# Patient Record
Sex: Female | Born: 1965 | Race: White | Hispanic: Yes | Smoking: Former smoker
Health system: Southern US, Community
[De-identification: ages and names within clinical notes are randomized; demographics above are authoritative.]

## PROBLEM LIST (undated history)

## (undated) DIAGNOSIS — S52502A Unspecified fracture of the lower end of left radius, initial encounter for closed fracture: Secondary | ICD-10-CM

## (undated) HISTORY — PX: TUBAL LIGATION: SHX77

---

## 2009-11-01 ENCOUNTER — Inpatient Hospital Stay (HOSPITAL_COMMUNITY): Admission: EM | Admit: 2009-11-01 | Discharge: 2009-11-04 | Payer: Self-pay | Admitting: Emergency Medicine

## 2010-09-04 LAB — CBC
HCT: 35.8 % — ABNORMAL LOW (ref 36.0–46.0)
MCHC: 33.6 g/dL (ref 30.0–36.0)
MCHC: 33.7 g/dL (ref 30.0–36.0)
MCV: 81 fL (ref 78.0–100.0)
Platelets: 146 10*3/uL — ABNORMAL LOW (ref 150–400)
RDW: 16.2 % — ABNORMAL HIGH (ref 11.5–15.5)
WBC: 6.4 10*3/uL (ref 4.0–10.5)

## 2010-09-04 LAB — DIFFERENTIAL
Basophils Absolute: 0 10*3/uL (ref 0.0–0.1)
Eosinophils Absolute: 0.1 10*3/uL (ref 0.0–0.7)
Lymphocytes Relative: 37 % (ref 12–46)
Lymphs Abs: 3.1 10*3/uL (ref 0.7–4.0)
Monocytes Absolute: 0.5 10*3/uL (ref 0.1–1.0)
Monocytes Relative: 6 % (ref 3–12)
Neutro Abs: 4.7 10*3/uL (ref 1.7–7.7)

## 2010-09-04 LAB — COMPREHENSIVE METABOLIC PANEL
Albumin: 3.8 g/dL (ref 3.5–5.2)
BUN: 10 mg/dL (ref 6–23)
Calcium: 8.5 mg/dL (ref 8.4–10.5)
Chloride: 106 mEq/L (ref 96–112)
Creatinine, Ser: 0.65 mg/dL (ref 0.4–1.2)
GFR calc Af Amer: >60 mL/min (ref >60)
GFR calc non Af Amer: >60 mL/min (ref >60)
Glucose, Bld: 118 mg/dL — ABNORMAL HIGH (ref 70–99)

## 2010-09-04 LAB — BASIC METABOLIC PANEL
BUN: 8 mg/dL (ref 6–23)
Calcium: 7.5 mg/dL — ABNORMAL LOW (ref 8.4–10.5)
Chloride: 113 mEq/L — ABNORMAL HIGH (ref 96–112)
GFR calc Af Amer: >60 mL/min (ref >60)
GFR calc non Af Amer: >60 mL/min (ref >60)
Potassium: 3.2 mEq/L — ABNORMAL LOW (ref 3.5–5.1)
Sodium: 144 mEq/L (ref 135–145)

## 2010-09-04 LAB — URINE CULTURE
Colony Count: NO GROWTH
Culture: NO GROWTH

## 2010-09-04 LAB — RAPID URINE DRUG SCREEN, HOSP PERFORMED: Amphetamines: NOT DETECTED

## 2010-09-04 LAB — URINALYSIS, ROUTINE W REFLEX MICROSCOPIC
Bilirubin Urine: NEGATIVE
Glucose, UA: NEGATIVE mg/dL
Ketones, ur: NEGATIVE mg/dL
Leukocytes, UA: NEGATIVE
Nitrite: NEGATIVE
Specific Gravity, Urine: 1.024 (ref 1.005–1.030)

## 2010-09-04 LAB — PREGNANCY, URINE: Preg Test, Ur: NEGATIVE

## 2010-09-04 LAB — PROTIME-INR
INR: 0.97 (ref 0.00–1.49)
Prothrombin Time: 12.8 seconds (ref 11.6–15.2)

## 2010-09-04 LAB — LIPASE, BLOOD: Lipase: 30 U/L (ref 11–59)

## 2010-09-04 LAB — ETHANOL: Alcohol, Ethyl (B): <5 mg/dL (ref 0–10)

## 2010-09-04 LAB — LACTIC ACID, PLASMA: Lactic Acid, Venous: 2.4 mmol/L — ABNORMAL HIGH (ref 0.5–2.2)

## 2010-09-04 LAB — URINE MICROSCOPIC-ADD ON

## 2011-10-18 ENCOUNTER — Other Ambulatory Visit: Payer: Self-pay | Admitting: Obstetrics and Gynecology

## 2011-10-18 DIAGNOSIS — Z1231 Encounter for screening mammogram for malignant neoplasm of breast: Secondary | ICD-10-CM

## 2011-10-23 ENCOUNTER — Encounter: Payer: Self-pay | Admitting: *Deleted

## 2011-10-23 ENCOUNTER — Ambulatory Visit (INDEPENDENT_AMBULATORY_CARE_PROVIDER_SITE_OTHER): Payer: Self-pay | Admitting: *Deleted

## 2011-10-23 ENCOUNTER — Ambulatory Visit (HOSPITAL_COMMUNITY): Payer: Self-pay

## 2011-10-23 ENCOUNTER — Ambulatory Visit (HOSPITAL_COMMUNITY): Admission: RE | Admit: 2011-10-23 | Payer: Self-pay | Source: Ambulatory Visit

## 2011-10-23 ENCOUNTER — Other Ambulatory Visit: Payer: Self-pay | Admitting: Obstetrics and Gynecology

## 2011-10-23 VITALS — BP 117/72 | HR 73 | Temp 97.7°F | Ht <= 58 in | Wt 162.8 lb

## 2011-10-23 DIAGNOSIS — N6310 Unspecified lump in the right breast, unspecified quadrant: Secondary | ICD-10-CM

## 2011-10-23 DIAGNOSIS — N63 Unspecified lump in unspecified breast: Secondary | ICD-10-CM

## 2011-10-23 DIAGNOSIS — Z01419 Encounter for gynecological examination (general) (routine) without abnormal findings: Secondary | ICD-10-CM

## 2011-10-23 NOTE — Progress Notes (Signed)
Complaints of right breast lump.  Pap Smear:    Pap smear completed today. Per patient last Pap smear was 10 years and normal. Per patient no history of abnormal Pap smears. No Pap smear results in EPIC.  Physical exam: Breasts Breasts symmetrical. No skin abnormalities bilateral breasts. Left nipple inverted per patient is normal for her. No nipple discharge bilateral breasts. No lymphadenopathy. No lumps palpated left breast. Palpated a small lump in right breast at 1 o'clock 9 cm from the nipple. No complaints of pain or tenderness on exam.  Patient referred to the [facility] for bilateral diagnostic mammogram and possible right breat ultrasound. Appointment scheduled for Tuesday, Oct 30, 2011 at 1430.        Pelvic/Bimanual   Ext Genitalia No lesions, no swelling and no discharge observed on external genitalia.         Vagina Vagina pink and normal texture. No lesions or discharge observed in vagina.          Cervix Cervix is present. Cervix pink and of normal texture. Cervix friable. No discharge observed at cervical os.     Uterus Uterus is present and palpable. Uterus in normal position and normal size.        Adnexae Bilateral ovaries present and palpable. No tenderness on palpation.          Rectovaginal No rectal exam completed today since patient had no rectal complaints. No skin abnormalities observed on exam.

## 2011-10-23 NOTE — Patient Instructions (Addendum)
Taught patient how to perform BSE and gave educational materials to take home. Let her know [facility] will cover Pap smears every 3 years unless has a history of abnormal Pap smears. Patient referred to the [facility] for bilateral diagnostic mammogram and possible right breast ultrasound. Appointment scheduled for Tuesday, Oct 30, 2011 at 1430. Patient aware of appointment and will be there. Let patient know will follow up with her within the next couple weeks with results. Patient verbalized understanding.

## 2011-10-30 ENCOUNTER — Ambulatory Visit
Admission: RE | Admit: 2011-10-30 | Discharge: 2011-10-30 | Disposition: A | Discharge status: Home / Self Care | Payer: No Typology Code available for payment source | Source: Ambulatory Visit | Attending: Obstetrics and Gynecology | Admitting: Obstetrics and Gynecology

## 2011-10-30 DIAGNOSIS — N63 Unspecified lump in unspecified breast: Secondary | ICD-10-CM

## 2011-11-13 ENCOUNTER — Telehealth: Payer: Self-pay

## 2011-11-13 NOTE — Telephone Encounter (Signed)
Called pt with Spanish interpreter, Genella Rife, and was unable to leave message due to no voicemail/answering service.

## 2011-12-03 ENCOUNTER — Telehealth: Payer: Self-pay | Admitting: *Deleted

## 2011-12-03 NOTE — Telephone Encounter (Signed)
Used interpreter Delorise Royals. Discussed results of diagnostic mammogram and advised screening mammogram in one year.

## 2014-04-19 ENCOUNTER — Encounter: Payer: Self-pay | Admitting: *Deleted

## 2016-01-06 ENCOUNTER — Encounter (HOSPITAL_BASED_OUTPATIENT_CLINIC_OR_DEPARTMENT_OTHER): Payer: Self-pay | Admitting: *Deleted

## 2016-01-09 NOTE — H&P (Signed)
[facility] [address]   [address] [place] [address] [phone] A Division of [facility]                                                                     RE: KORALYNN, SUER   [id number]   [birth year] PROGRESS NOTE: 01-04-16 Reason for visit: Follow up left distal radius injury on December 28, 2015.   History of present illness: She was referred to me by Dr. Charlett Blake.  She was walking down some stairs and slipped and fell onto her outstretched left hand.  She denies any numbness or tingling.  She has been in a splint.  Pain has been well controlled.  Date of injury: December 28, 2015.     Please see associated documentation for this clinic visit for further past medical, family, surgical and social history, review of systems, and exam findings as this was reviewed by me.  EXAMINATION: Well appearing female in no apparent distress.  The left upper extremity she is neurovascularly intact.  She has swelling around the wrist, but it is well controlled.       X-RAYS: I reviewed three views of her wrist today.  There is significant volar displacement with shortening and a large step off at her joint.    ASSESSMENT/PLAN: Left distal radius fracture, intraarticular, with multiple fragments with articular step off.  I discussed the risks and benefits of ORIF versus non-operative treatment today.  She would like to go forward with ORIF.  We will set this up soon.    Jewel Baize.  Eulah Pont, M.D.  Electronically verified by Jewel Baize. Eulah Pont, M.D. TDM:jjh D 01-05-16 T 01-08-16

## 2016-01-10 ENCOUNTER — Ambulatory Visit (HOSPITAL_BASED_OUTPATIENT_CLINIC_OR_DEPARTMENT_OTHER)
Admission: RE | Admit: 2016-01-10 | Discharge: 2016-01-10 | Disposition: A | Discharge status: Home / Self Care | Payer: Self-pay | Source: Ambulatory Visit | Attending: Orthopedic Surgery | Admitting: Orthopedic Surgery

## 2016-01-10 ENCOUNTER — Ambulatory Visit (HOSPITAL_BASED_OUTPATIENT_CLINIC_OR_DEPARTMENT_OTHER): Payer: Self-pay | Admitting: Anesthesiology

## 2016-01-10 ENCOUNTER — Encounter (HOSPITAL_BASED_OUTPATIENT_CLINIC_OR_DEPARTMENT_OTHER)
Admission: RE | Disposition: A | Discharge status: Home / Self Care | Payer: Self-pay | Source: Ambulatory Visit | Attending: Orthopedic Surgery

## 2016-01-10 ENCOUNTER — Encounter (HOSPITAL_BASED_OUTPATIENT_CLINIC_OR_DEPARTMENT_OTHER): Payer: Self-pay | Admitting: Anesthesiology

## 2016-01-10 DIAGNOSIS — Z6830 Body mass index (BMI) 30.0-30.9, adult: Secondary | ICD-10-CM | POA: Insufficient documentation

## 2016-01-10 DIAGNOSIS — W109XXA Fall (on) (from) unspecified stairs and steps, initial encounter: Secondary | ICD-10-CM | POA: Insufficient documentation

## 2016-01-10 DIAGNOSIS — Z87891 Personal history of nicotine dependence: Secondary | ICD-10-CM | POA: Insufficient documentation

## 2016-01-10 DIAGNOSIS — S52502A Unspecified fracture of the lower end of left radius, initial encounter for closed fracture: Secondary | ICD-10-CM | POA: Insufficient documentation

## 2016-01-10 HISTORY — PX: ORIF WRIST FRACTURE: SHX2133

## 2016-01-10 HISTORY — DX: Unspecified fracture of the lower end of left radius, initial encounter for closed fracture: S52.502A

## 2016-01-10 SURGERY — OPEN REDUCTION INTERNAL FIXATION (ORIF) WRIST FRACTURE
Anesthesia: General | Site: Wrist | Laterality: Left

## 2016-01-10 MED ORDER — MIDAZOLAM HCL 2 MG/2ML IJ SOLN
INTRAMUSCULAR | Status: AC
  Filled 2016-01-10: qty 2

## 2016-01-10 MED ORDER — MIDAZOLAM HCL 2 MG/2ML IJ SOLN
1.0000 mg | INTRAMUSCULAR | Status: DC | PRN
  Administered 2016-01-10: 1 mg via INTRAVENOUS

## 2016-01-10 MED ORDER — MIDAZOLAM HCL 2 MG/2ML IJ SOLN: 0.5000 mg | Freq: Once | INTRAMUSCULAR | Status: DC | PRN

## 2016-01-10 MED ORDER — ONDANSETRON HCL 4 MG/2ML IJ SOLN
INTRAMUSCULAR | Status: AC
  Filled 2016-01-10: qty 2

## 2016-01-10 MED ORDER — DEXAMETHASONE SODIUM PHOSPHATE 10 MG/ML IJ SOLN
INTRAMUSCULAR | Status: AC
  Filled 2016-01-10: qty 1

## 2016-01-10 MED ORDER — PHENYLEPHRINE HCL 10 MG/ML IJ SOLN
INTRAMUSCULAR | Status: DC | PRN
Start: 2016-01-10 — End: 2016-01-10
  Administered 2016-01-10 (×2): 80 ug via INTRAVENOUS

## 2016-01-10 MED ORDER — MEPERIDINE HCL 25 MG/ML IJ SOLN: 6.2500 mg | INTRAMUSCULAR | Status: DC | PRN

## 2016-01-10 MED ORDER — SUCCINYLCHOLINE CHLORIDE 200 MG/10ML IV SOSY
PREFILLED_SYRINGE | INTRAVENOUS | Status: AC
  Filled 2016-01-10: qty 10

## 2016-01-10 MED ORDER — BUPIVACAINE HCL (PF) 0.5 % IJ SOLN
INTRAMUSCULAR | Status: AC
  Filled 2016-01-10: qty 30

## 2016-01-10 MED ORDER — ONDANSETRON HCL 4 MG PO TABS: 4.0000 mg | ORAL_TABLET | Freq: Three times a day (TID) | ORAL | 0 refills | Status: DC | PRN

## 2016-01-10 MED ORDER — POVIDONE-IODINE 10 % EX SWAB: 2.0000 "application " | Freq: Once | CUTANEOUS | Status: DC

## 2016-01-10 MED ORDER — BUPIVACAINE-EPINEPHRINE (PF) 0.5% -1:200000 IJ SOLN
INTRAMUSCULAR | Status: DC | PRN
  Administered 2016-01-10: 30 mL via PERINEURAL

## 2016-01-10 MED ORDER — CEFAZOLIN SODIUM-DEXTROSE 2-4 GM/100ML-% IV SOLN
INTRAVENOUS | Status: AC
  Filled 2016-01-10: qty 100

## 2016-01-10 MED ORDER — LIDOCAINE 2% (20 MG/ML) 5 ML SYRINGE
INTRAMUSCULAR | Status: AC
  Filled 2016-01-10: qty 5

## 2016-01-10 MED ORDER — ACETAMINOPHEN 500 MG PO TABS
1000.0000 mg | ORAL_TABLET | Freq: Once | ORAL | Status: AC
  Administered 2016-01-10: 1000 mg via ORAL

## 2016-01-10 MED ORDER — PROPOFOL 10 MG/ML IV BOLUS
INTRAVENOUS | Status: DC | PRN
  Administered 2016-01-10: 150 mg via INTRAVENOUS

## 2016-01-10 MED ORDER — HYDROCODONE-ACETAMINOPHEN 5-325 MG PO TABS: 1.0000 | ORAL_TABLET | ORAL | 0 refills | Status: DC | PRN

## 2016-01-10 MED ORDER — LACTATED RINGERS IV SOLN
INTRAVENOUS | Status: DC
  Administered 2016-01-10 (×2): via INTRAVENOUS

## 2016-01-10 MED ORDER — CEFAZOLIN SODIUM-DEXTROSE 2-4 GM/100ML-% IV SOLN
2.0000 g | INTRAVENOUS | Status: AC
  Administered 2016-01-10: 2 g via INTRAVENOUS

## 2016-01-10 MED ORDER — GLYCOPYRROLATE 0.2 MG/ML IJ SOLN: 0.2000 mg | Freq: Once | INTRAMUSCULAR | Status: DC | PRN

## 2016-01-10 MED ORDER — SCOPOLAMINE 1 MG/3DAYS TD PT72: 1.0000 | MEDICATED_PATCH | Freq: Once | TRANSDERMAL | Status: DC | PRN

## 2016-01-10 MED ORDER — FENTANYL CITRATE (PF) 100 MCG/2ML IJ SOLN
INTRAMUSCULAR | Status: AC
  Filled 2016-01-10: qty 2

## 2016-01-10 MED ORDER — HYDROMORPHONE HCL 1 MG/ML IJ SOLN: 0.2500 mg | INTRAMUSCULAR | Status: DC | PRN

## 2016-01-10 MED ORDER — EPHEDRINE SULFATE 50 MG/ML IJ SOLN
INTRAMUSCULAR | Status: DC | PRN
  Administered 2016-01-10: 10 mg via INTRAVENOUS

## 2016-01-10 MED ORDER — DOCUSATE SODIUM 100 MG PO CAPS: 100.0000 mg | ORAL_CAPSULE | Freq: Two times a day (BID) | ORAL | 0 refills | Status: DC

## 2016-01-10 MED ORDER — PROMETHAZINE HCL 25 MG/ML IJ SOLN: 6.2500 mg | INTRAMUSCULAR | Status: DC | PRN

## 2016-01-10 MED ORDER — CHLORHEXIDINE GLUCONATE 4 % EX LIQD: 60.0000 mL | Freq: Once | CUTANEOUS | Status: DC

## 2016-01-10 MED ORDER — FENTANYL CITRATE (PF) 100 MCG/2ML IJ SOLN
50.0000 ug | INTRAMUSCULAR | Status: DC | PRN
  Administered 2016-01-10: 50 ug via INTRAVENOUS

## 2016-01-10 MED ORDER — LIDOCAINE HCL (CARDIAC) 20 MG/ML IV SOLN
INTRAVENOUS | Status: DC | PRN
  Administered 2016-01-10: 30 mg via INTRAVENOUS

## 2016-01-10 MED ORDER — DEXAMETHASONE SODIUM PHOSPHATE 10 MG/ML IJ SOLN
INTRAMUSCULAR | Status: DC | PRN
  Administered 2016-01-10: 10 mg via INTRAVENOUS

## 2016-01-10 MED ORDER — ACETAMINOPHEN 500 MG PO TABS
ORAL_TABLET | ORAL | Status: AC
  Filled 2016-01-10: qty 2

## 2016-01-10 MED ORDER — LIDOCAINE-EPINEPHRINE (PF) 1.5 %-1:200000 IJ SOLN
INTRAMUSCULAR | Status: DC | PRN
  Administered 2016-01-10: 20 mL via PERINEURAL

## 2016-01-10 SURGICAL SUPPLY — 70 items
BANDAGE ACE 4X5 VEL STRL LF (GAUZE/BANDAGES/DRESSINGS) ×3 IMPLANT
BIT DRILL 2.2 SS TIBIAL (BIT) ×3 IMPLANT
BLADE SURG 15 STRL LF DISP TIS (BLADE) ×2 IMPLANT
BLADE SURG 15 STRL SS (BLADE) ×4
BNDG ESMARK 4X9 LF (GAUZE/BANDAGES/DRESSINGS) ×3 IMPLANT
CHLORAPREP W/TINT 26ML (MISCELLANEOUS) ×3 IMPLANT
CLOSURE STERI-STRIP 1/2X4 (GAUZE/BANDAGES/DRESSINGS) ×1
CLSR STERI-STRIP ANTIMIC 1/2X4 (GAUZE/BANDAGES/DRESSINGS) ×2 IMPLANT
CORDS BIPOLAR (ELECTRODE) ×3 IMPLANT
COVER BACK TABLE 60X90IN (DRAPES) ×3 IMPLANT
CUFF TOURNIQUET SINGLE 18IN (TOURNIQUET CUFF) ×3 IMPLANT
CUFF TOURNIQUET SINGLE 24IN (TOURNIQUET CUFF) IMPLANT
DECANTER SPIKE VIAL GLASS SM (MISCELLANEOUS) IMPLANT
DRAPE EXTREMITY T 121X128X90 (DRAPE) ×3 IMPLANT
DRAPE IMP U-DRAPE 54X76 (DRAPES) ×3 IMPLANT
DRAPE OEC MINIVIEW 54X84 (DRAPES) ×3 IMPLANT
DRAPE SURG 17X23 STRL (DRAPES) IMPLANT
DRSG EMULSION OIL 3X3 NADH (GAUZE/BANDAGES/DRESSINGS) ×3 IMPLANT
GAUZE SPONGE 4X4 12PLY STRL (GAUZE/BANDAGES/DRESSINGS) ×3 IMPLANT
GAUZE XEROFORM 1X8 LF (GAUZE/BANDAGES/DRESSINGS) IMPLANT
GLOVE BIO SURGEON STRL SZ 6.5 (GLOVE) ×2 IMPLANT
GLOVE BIO SURGEON STRL SZ7.5 (GLOVE) ×6 IMPLANT
GLOVE BIO SURGEONS STRL SZ 6.5 (GLOVE) ×1
GLOVE BIOGEL PI IND STRL 7.0 (GLOVE) ×2 IMPLANT
GLOVE BIOGEL PI IND STRL 8 (GLOVE) ×2 IMPLANT
GLOVE BIOGEL PI INDICATOR 7.0 (GLOVE) ×4
GLOVE BIOGEL PI INDICATOR 8 (GLOVE) ×4
GOWN STRL REUS W/ TWL LRG LVL3 (GOWN DISPOSABLE) ×2 IMPLANT
GOWN STRL REUS W/ TWL XL LVL3 (GOWN DISPOSABLE) ×1 IMPLANT
GOWN STRL REUS W/TWL LRG LVL3 (GOWN DISPOSABLE) ×4
GOWN STRL REUS W/TWL XL LVL3 (GOWN DISPOSABLE) ×2
K-WIRE 1.6 (WIRE) ×6
K-WIRE FX5X1.6XNS BN SS (WIRE) ×3
KWIRE FX5X1.6XNS BN SS (WIRE) ×3 IMPLANT
NEEDLE HYPO 25X1 1.5 SAFETY (NEEDLE) ×3 IMPLANT
NS IRRIG 1000ML POUR BTL (IV SOLUTION) ×3 IMPLANT
PACK BASIN DAY SURGERY FS (CUSTOM PROCEDURE TRAY) ×3 IMPLANT
PAD CAST 4YDX4 CTTN HI CHSV (CAST SUPPLIES) ×1 IMPLANT
PADDING CAST ABS 4INX4YD NS (CAST SUPPLIES) ×2
PADDING CAST ABS COTTON 4X4 ST (CAST SUPPLIES) ×1 IMPLANT
PADDING CAST COTTON 4X4 STRL (CAST SUPPLIES) ×2
PEG LOCKING SMOOTH 2.2X18 (Screw) ×18 IMPLANT
PLATE MINI LT DVR CROSSLOCK (Plate) ×3 IMPLANT
SCREW LOCK 14X2.7X 3 LD TPR (Screw) ×1 IMPLANT
SCREW LOCKING 2.7X13MM (Screw) ×3 IMPLANT
SCREW LOCKING 2.7X14 (Screw) ×2 IMPLANT
SLEEVE SCD COMPRESS KNEE MED (MISCELLANEOUS) ×3 IMPLANT
SPLINT FAST PLASTER 5X30 (CAST SUPPLIES)
SPLINT PLASTER CAST FAST 5X30 (CAST SUPPLIES) IMPLANT
SPLINT PLASTER CAST XFAST 3X15 (CAST SUPPLIES) IMPLANT
SPLINT PLASTER CAST XFAST 4X15 (CAST SUPPLIES) IMPLANT
SPLINT PLASTER XTRA FAST SET 4 (CAST SUPPLIES)
SPLINT PLASTER XTRA FASTSET 3X (CAST SUPPLIES)
SUCTION FRAZIER HANDLE 10FR (MISCELLANEOUS)
SUCTION TUBE FRAZIER 10FR DISP (MISCELLANEOUS) IMPLANT
SUT ETHILON 3 0 PS 1 (SUTURE) IMPLANT
SUT MON AB 2-0 CT1 36 (SUTURE) ×3 IMPLANT
SUT MON AB 4-0 PC3 18 (SUTURE) ×3 IMPLANT
SUT PROLENE 3 0 PS 2 (SUTURE) IMPLANT
SUT VIC AB 0 SH 27 (SUTURE) ×3 IMPLANT
SUT VIC AB 2-0 SH 27 (SUTURE) ×2
SUT VIC AB 2-0 SH 27XBRD (SUTURE) ×1 IMPLANT
SUT VIC AB 3-0 FS2 27 (SUTURE) IMPLANT
SYR BULB 3OZ (MISCELLANEOUS) ×3 IMPLANT
SYR CONTROL 10ML LL (SYRINGE) ×3 IMPLANT
TOWEL OR 17X24 6PK STRL BLUE (TOWEL DISPOSABLE) ×3 IMPLANT
TOWEL OR NON WOVEN STRL DISP B (DISPOSABLE) ×3 IMPLANT
TUBE CONNECTING 20'X1/4 (TUBING)
TUBE CONNECTING 20X1/4 (TUBING) IMPLANT
UNDERPAD 30X30 (UNDERPADS AND DIAPERS) ×3 IMPLANT

## 2016-01-10 NOTE — Anesthesia Procedure Notes (Signed)
Anesthesia Regional Block:  Axillary brachial plexus block  Pre-Anesthetic Checklist: ,, timeout performed, Correct Patient, Correct Site, Correct Laterality, Correct Procedure, Correct Position, site marked, Risks and benefits discussed,  Surgical consent,  Pre-op evaluation,  At surgeon's request and post-op pain management  Laterality: Left and Upper  Prep: chloraprep       Needles:  Injection technique: Single-shot  Needle Type: Other   (short "B" bevel)   Needle Length: 4cm 4 cm Needle Gauge: 22 and 22 G    Additional Needles:  Procedures: paresthesia technique Axillary brachial plexus block  Nerve Stimulator or Paresthesia:  Response: transient median nerve paresthesia,  Response: transient ulnar nerve paresthesia,   Additional Responses:  Pt identified in Holding room.  Monitors applied. Working IV access confirmed. Sterile prep L axilla.  #22ga short "B" bevel to transient median and ulnar paresthesiae.  Total 20cc 1.5% lidocaine with 1:200k epi and 30cc 0.5% Bupivacaine with 1:200k epi injected incrementally after negative test doses, distributed around each paresthesia.  Patient asymptomatic, VSS, no heme aspirated, tolerated well.  Sandford Craze, MD   [phone] Narrative:

## 2016-01-10 NOTE — Op Note (Signed)
01/10/2016  1:38 PM  PATIENT:  Kim Cain    PRE-OPERATIVE DIAGNOSIS:  FRACTURE OF THE LEFT DISTAL RADIUS FOR CLOSED FRACTURE  POST-OPERATIVE DIAGNOSIS:  Same  PROCEDURE:  OPEN REDUCTION INTERNAL FIXATION (ORIF) LEFT WRIST FRACTURE  SURGEON:  Trenton Passow, Jewel Baize, MD  ASSISTANT: Aquilla Hacker, PA-C, he was present and scrubbed throughout the case, critical for completion in a timely fashion, and for retraction, instrumentation, and closure.   ANESTHESIA:   gen  PREOPERATIVE INDICATIONS:  [name] is a  50 y.o. female with a diagnosis of FRACTURE OF THE LEFT DISTAL RADIUS FOR CLOSED FRACTURE who failed conservative measures and elected for surgical management.    The risks benefits and alternatives were discussed with the patient preoperatively including but not limited to the risks of infection, bleeding, nerve injury, cardiopulmonary complications, the need for revision surgery, among others, and the patient was willing to proceed.  OPERATIVE IMPLANTS: DVR plate  OPERATIVE FINDINGS: articular comminution with displacement >3 pieces  BLOOD LOSS: min  COMPLICATIONS: none  TOURNIQUET TIME:  OPERATIVE PROCEDURE:  Patient was identified in the preoperative holding area and site was marked by me She was transported to the operating theater and placed on the table in supine position taking care to pad all bony prominences. After a preincinduction time out anesthesia was induced. The left upper extremity was prepped and draped in normal sterile fashion and a pre-incision timeout was performed. She received ancef for preoperative antibiotics.   I made a 5 cm incision centered over her FCR tendon and dissected down carefully to the level of the flexor tendon sheath and incise this longitudinally and retracted the FCR radially and incised the dorsal aspect of the sheath.   I bluntly dissected the FPL muscle belly away from the brachioradialis and then  sharply incised the pronator tendon from the distal radius and from the wrist capsule. I Elevated this off the bone the fractures visible.   I released the brachioradialis from its insertion. I then debrided the fracture and performed a manual reduction.   I selected a plate and I placed it on the bone. I pinned it into place and was happy on multiple radiographic views with it's placement. I fixed it to the shaft as this was a volar barton fracture.  I then fixed the plate distally with the locking pegs. I confirmed no articular penetration with the pegs and that none were prominent dorsally.    I was happy with the final fluoro xrays    I thoroughly irrigated the wound and closed the pronator over top of the plate and then closed the skin in layers with absorbable stitch. Sterile dressing was applied using the PACU in stable condition.  POST OPERATIVE PLAN: NWB, Splint full time. Ambulate for DVT px.

## 2016-01-10 NOTE — Discharge Instructions (Signed)
Diet: As you were doing prior to hospitalization   Bathing:  You have a splint on, leave the splint in place and keep the splint dry with a plastic bag.  Dressing:  You have a splint.  Leave the splint in place and we will change your bandages during your first follow-up appointment.    You may use ibuprofen in addition to prescribed pain medicine.  Activity:  Increase activity slowly as tolerated, but follow the weight bearing instructions below.  The rules on driving is that you can not be taking narcotics while you drive, and you must feel in control of the vehicle.    Weight Bearing:  Non-weightbearing left arm for 6 weeks.  Regional Anesthesia Blocks  1. Numbness or the inability to move the "blocked" extremity may last from 3-48 hours after placement. The length of time depends on the medication injected and your individual response to the medication. If the numbness is not going away after 48 hours, call your surgeon.  2. The extremity that is blocked will need to be protected until the numbness is gone and the  Strength has returned. Because you cannot feel it, you will need to take extra care to avoid injury. Because it may be weak, you may have difficulty moving it or using it. You may not know what position it is in without looking at it while the block is in effect.  3. For blocks in the legs and feet, returning to weight bearing and walking needs to be done carefully. You will need to wait until the numbness is entirely gone and the strength has returned. You should be able to move your leg and foot normally before you try and bear weight or walk. You will need someone to be with you when you first try to ensure you do not fall and possibly risk injury.  4. Bruising and tenderness at the needle site are common side effects and will resolve in a few days.  5. Persistent numbness or new problems with movement should be communicated to the surgeon or the [facility]  [phone] [facility] [phone]).  Post Anesthesia Home Care Instructions  Activity: Get plenty of rest for the remainder of the day. A responsible adult should stay with you for 24 hours following the procedure.  For the next 24 hours, DO NOT: -Drive a car -Advertising copywriter -Drink alcoholic beverages -Take any medication unless instructed by your physician -Make any legal decisions or sign important papers.  Meals: Start with liquid foods such as gelatin or soup. Progress to regular foods as tolerated. Avoid greasy, spicy, heavy foods. If nausea and/or vomiting occur, drink only clear liquids until the nausea and/or vomiting subsides. Call your physician if vomiting continues.  Special Instructions/Symptoms: Your throat may feel dry or sore from the anesthesia or the breathing tube placed in your throat during surgery. If this causes discomfort, gargle with warm salt water. The discomfort should disappear within 24 hours.  If you had a scopolamine patch placed behind your ear for the management of post- operative nausea and/or vomiting:  1. The medication in the patch is effective for 72 hours, after which it should be removed.  Wrap patch in a tissue and discard in the trash. Wash hands thoroughly with soap and water. 2. You may remove the patch earlier than 72 hours if you experience unpleasant side effects which may include dry mouth, dizziness or visual disturbances. 3. Avoid touching the patch. Wash your  hands with soap and water after contact with the patch.     To prevent constipation: you may use a stool softener such as -  Colace (over the counter) 100 mg by mouth twice a day  Drink plenty of fluids (prune juice may be helpful) and high fiber foods Miralax (over the counter) for constipation as needed.    Itching:  If you experience itching with your medications, try taking only a single pain pill, or even half a pain pill at a time.  You may take up  to 10 pain pills per day, and you can also use benadryl over the counter for itching or also to help with sleep.   Precautions:  If you experience chest pain or shortness of breath - call 911 immediately for transfer to the hospital emergency department!!  If you develop a fever greater that 101 F, purulent drainage from wound, increased redness or drainage from wound, or calf pain -- Call the office at [phone]                                                 Follow- Up Appointment:  Please call for an appointment to be seen in 2 weeks [place] - [phone]

## 2016-01-10 NOTE — Interval H&P Note (Signed)
History and Physical Interval Note:  01/10/2016 11:36 AM  Kim Cain  has presented today for surgery, with the diagnosis of FRACTURE OF THE LEFT DISTAL RADIUS FOR CLOSED FRACTURE  The various methods of treatment have been discussed with the patient and family. After consideration of risks, benefits and other options for treatment, the patient has consented to  Procedure(s): OPEN REDUCTION INTERNAL FIXATION (ORIF) LEFT WRIST FRACTURE (Left) as a surgical intervention .  The patient's history has been reviewed, patient examined, no change in status, stable for surgery.  I have reviewed the patient's chart and labs.  Questions were answered to the patient's satisfaction.     Ramiya Delahunty D

## 2016-01-10 NOTE — Transfer of Care (Signed)
Immediate Anesthesia Transfer of Care Note  Patient: Kim Cain  Procedure(s) Performed: Procedure(s): OPEN REDUCTION INTERNAL FIXATION (ORIF) LEFT WRIST FRACTURE (Left)  Patient Location: PACU  Anesthesia Type:GA combined with regional for post-op pain  Level of Consciousness: awake, sedated and patient cooperative  Airway & Oxygen Therapy: Patient Spontanous Breathing and Patient connected to face mask oxygen  Post-op Assessment: Report given to RN and Post -op Vital signs reviewed and stable  Post vital signs: Reviewed and stable  Last Vitals:  Vitals:   01/10/16 1220 01/10/16 1225  BP:    Pulse: 73 87  Resp: 10 14  Temp:      Last Pain:  Vitals:   01/10/16 1143  TempSrc: Oral  PainSc: 1       Patients Stated Pain Goal: 1 (01/10/16 1143)  Complications: No apparent anesthesia complications

## 2016-01-10 NOTE — Anesthesia Procedure Notes (Signed)
Procedure Name: LMA Insertion Date/Time: 01/10/2016 12:36 PM Performed by: Zenia Resides D Pre-anesthesia Checklist: Patient identified, Emergency Drugs available, Suction available and Patient being monitored Patient Re-evaluated:Patient Re-evaluated prior to inductionOxygen Delivery Method: Circle system utilized Preoxygenation: Pre-oxygenation with 100% oxygen Intubation Type: IV induction Ventilation: Mask ventilation without difficulty LMA: LMA inserted LMA Size: 4.0 Number of attempts: 1 Airway Equipment and Method: Bite block Placement Confirmation: positive ETCO2 Tube secured with: Tape Dental Injury: Teeth and Oropharynx as per pre-operative assessment

## 2016-01-10 NOTE — Anesthesia Preprocedure Evaluation (Signed)
Anesthesia Evaluation  Patient identified by MRN, date of birth, ID band Patient awake    Reviewed: Allergy & Precautions, NPO status , Patient's Chart, lab work & pertinent test results  History of Anesthesia Complications Negative for: history of anesthetic complications  Airway Mallampati: I  TM Distance: >3 FB Neck ROM: Full    Dental  (+) Dental Advisory Given   Pulmonary former smoker,    breath sounds clear to auscultation       Cardiovascular negative cardio ROS   Rhythm:Regular Rate:Normal     Neuro/Psych negative neurological ROS     GI/Hepatic negative GI ROS, Neg liver ROS,   Endo/Other  Morbid obesity  Renal/GU negative Renal ROS     Musculoskeletal negative musculoskeletal ROS (+)   Abdominal (+) + obese,   Peds  Hematology negative hematology ROS (+)   Anesthesia Other Findings   Reproductive/Obstetrics negative OB ROS S/p BTL                            Anesthesia Physical Anesthesia Plan  ASA: II  Anesthesia Plan: General   Post-op Pain Management: GA combined w/ Regional for post-op pain   Induction: Intravenous  Airway Management Planned: LMA  Additional Equipment:   Intra-op Plan:   Post-operative Plan:   Informed Consent: I have reviewed the patients History and Physical, chart, labs and discussed the procedure including the risks, benefits and alternatives for the proposed anesthesia with the patient or authorized representative who has indicated his/her understanding and acceptance.   Dental advisory given  Plan Discussed with: CRNA and Surgeon  Anesthesia Plan Comments: (Plan routine monitors, GA- LMA OK, axillary block for post op analgesia)        Anesthesia Quick Evaluation

## 2016-01-11 NOTE — Anesthesia Postprocedure Evaluation (Signed)
Anesthesia Post Note  Patient: Kim Cain  Procedure(s) Performed: Procedure(s) (LRB): OPEN REDUCTION INTERNAL FIXATION (ORIF) LEFT WRIST FRACTURE (Left)  Patient location during evaluation: PACU Anesthesia Type: General Level of consciousness: awake and alert Pain management: pain level controlled Vital Signs Assessment: post-procedure vital signs reviewed and stable Respiratory status: spontaneous breathing, nonlabored ventilation and respiratory function stable Cardiovascular status: blood pressure returned to baseline and stable Postop Assessment: no signs of nausea or vomiting Anesthetic complications: no     Last Vitals:  Vitals:   01/10/16 1457 01/10/16 1547  BP:  120/61  Pulse: 84 74  Resp: 11 16  Temp:  36.9 C    Last Pain:  Vitals:   01/10/16 1547  TempSrc: Oral  PainSc: 0-No pain   Pain Goal: Patients Stated Pain Goal: 1 (01/10/16 1143)               Lusine Corlett A

## 2016-01-12 NOTE — Addendum Note (Signed)
Addendum  created 01/12/16 0748 by Gar Gibbon, CRNA   Charge Capture section accepted

## 2016-01-13 ENCOUNTER — Encounter (HOSPITAL_BASED_OUTPATIENT_CLINIC_OR_DEPARTMENT_OTHER): Payer: Self-pay | Admitting: Orthopedic Surgery

## 2017-05-08 ENCOUNTER — Encounter (HOSPITAL_COMMUNITY): Payer: Self-pay

## 2021-02-28 ENCOUNTER — Ambulatory Visit: Payer: Self-pay | Admitting: Physician Assistant

## 2021-02-28 ENCOUNTER — Other Ambulatory Visit: Payer: Self-pay

## 2021-02-28 VITALS — BP 125/71 | HR 81 | Temp 98.2°F | Resp 18 | Ht 59.0 in | Wt 178.0 lb

## 2021-02-28 DIAGNOSIS — R070 Pain in throat: Secondary | ICD-10-CM | POA: Insufficient documentation

## 2021-02-28 DIAGNOSIS — Z13228 Encounter for screening for other metabolic disorders: Secondary | ICD-10-CM

## 2021-02-28 DIAGNOSIS — Z114 Encounter for screening for human immunodeficiency virus [HIV]: Secondary | ICD-10-CM

## 2021-02-28 DIAGNOSIS — R32 Unspecified urinary incontinence: Secondary | ICD-10-CM | POA: Insufficient documentation

## 2021-02-28 DIAGNOSIS — Z13220 Encounter for screening for lipoid disorders: Secondary | ICD-10-CM

## 2021-02-28 DIAGNOSIS — I83893 Varicose veins of bilateral lower extremities with other complications: Secondary | ICD-10-CM | POA: Insufficient documentation

## 2021-02-28 DIAGNOSIS — Z1159 Encounter for screening for other viral diseases: Secondary | ICD-10-CM

## 2021-02-28 DIAGNOSIS — N39498 Other specified urinary incontinence: Secondary | ICD-10-CM

## 2021-02-28 MED ORDER — CETIRIZINE HCL 10 MG PO TABS
10.0000 mg | ORAL_TABLET | Freq: Every day | ORAL | 11 refills | Status: DC
  Filled 2021-02-28: qty 30, 30d supply, fill #0

## 2021-02-28 MED ORDER — OMEPRAZOLE 20 MG PO CPDR
20.0000 mg | DELAYED_RELEASE_CAPSULE | Freq: Every day | ORAL | 3 refills | Status: DC
  Filled 2021-02-28: qty 30, 30d supply, fill #0

## 2021-02-28 NOTE — Patient Instructions (Addendum)
You will start taking zyrtec and omeprazole once daily to help with your throat discomfort.  Please return to mobile unit tomorrow AM for fasting labs.   Incontinencia urinaria Urinary Incontinence La incontinencia urinaria es una afeccin en la cual una persona no puede controlar dnde y IT sales professional. Neomia Dear persona con esta afeccin orinar en un momento y Administrator no deseados (involuntariamente). Cules son las causas? Esta afeccin puede ser causada por lo siguiente: Medicamentos. Infecciones. Estreimiento. Msculos de la vejiga hiperactivos. Msculos de la vejiga dbiles. Msculos del suelo de la pelvis dbiles. Estos msculos proporcionan apoyo a la vejiga, el intestino y [address], en el caso de las [place]. Aumento del tamao de la prstata en los hombres. La prstata es una glndula cerca de la vejiga. Cuando se [place], puede comprimir la [place]. Si la uretra est obstruida, la vejiga puede debilitarse y perder la capacidad para vaciarse correctamente. Ciruga. Factores emocionales, como la ansiedad, el estrs o el trastorno de estrs postraumtico (TEP). Prolapso de los rganos de la pelvis. Esto ocurre en las mujeres, cuando los rganos se salen de Environmental consultant, hacia la vagina. Este cambio de lugar puede evitar que la vejiga y la uretra funcionen correctamente. Qu incrementa el riesgo? Los siguientes factores pueden hacer que usted sea ms propenso a Warehouse manager esta afeccin: Edad avanzada. Obesidad e inactividad fsica. Embarazo y [place]. Menopausia. Las enfermedades que [employer] nervios o la mdula espinal (enfermedades neurolgicas). Tos de larga duracin (crnica). Esto puede aumentar la presin en los msculos de la vejiga y del suelo plvico. Cules son los signos o los sntomas? Los sntomas pueden variar, segn el tipo de incontinencia urinaria que tenga. Estos incluyen los siguientes: Una necesidad repentina de Geographical information systems officer, y orinar involuntariamente antes de llegar al  bao (incontinencia imperiosa). Orinar repentinamente al realizar una actividad que provoque la miccin, como toser, rer, Radio producer ejercicio o estornudar (incontinencia urinaria de esfuerzo). Tener ganas de orinar con frecuencia, y Geographical information systems officer en pequeas cantidades o por goteo (incontinencia por rebosamiento). Orinar porque no puede llegar al bao a tiempo a causa de una discapacidad fsica, como la artritis o una lesin, o de afecciones comunicativas o cognitivas, como la enfermedad de Alzheimer (incontinencia funcional). Cmo se diagnostica? Esta afeccin se puede diagnosticar en funcin de lo siguiente: Sus antecedentes mdicos. Un examen fsico. Estudios, como por ejemplo: Anlisis de [place]. Radiografas del rin y de la vejiga. Ecografa. Exploracin por tomografa computarizada (TC). Cistoscopia. En este procedimiento, el mdico inserta un tubo con Neomia Dear luz y Posey Boyer (cistoscopio) por la uretra hacia la vejiga para Stage manager. Estudios urodinmicos. Estos estudios evalan la capacidad de la vejiga, de la uretra y del esfnter para Academic librarian y Equities trader orina. Hay distintos tipos de estudios urodinmicos, que pueden variar segn lo que Administrator, sports. Para ayudar a diagnosticar la afeccin, el mdico puede recomendarle que mantenga un registro de la frecuencia con la que [place] y de la cantidad de [place]. Cmo se trata? El tratamiento de esta afeccin depende del tipo de incontinencia que tenga y de su causa. El tratamiento puede incluir lo siguiente: Cambios en el estilo de vida, como por ejemplo: Dejar de fumar. Mantener un peso saludable. Mantenerse activo. Trate de hacer 150 minutos de ejercicio de intensidad moderada cada semana. Pregntele al mdico qu actividades son seguras para usted. Consumir una dieta saludable. Evite los alimentos con alto contenido de grasa, como los alimentos fritos. Reduzca el consumo de carbohidratos refinados, como el pan blanco y [place]  blanco. Limite la cantidad de cafena que consume. Aumente el consumo de [place]. Los alimentos como frutas y verduras frescas, los frijoles y los cereales integrales son fuentes saludables de [place]. Ejercicios para el msculo del piso plvico. Ejercitacin de la vejiga, como prolongar la cantidad de [employer] las idas al bao o ir al bao en intervalos regulares. Aplicar tcnicas para reducir las urgencias de la vejiga. Esto puede incluir tcnicas de distraccin o ejercicios de respiracin controlada. Medicamentos para International aid/development worker los msculos de la vejiga y Automotive engineer los espasmos de la vejiga. Medicamentos para disminuir o Regulatory affairs officer de la prstata, en el caso de los hombres. Inyecciones de btox. Esto puede ayudar a [employer] de la vejiga. Usar pulsos elctricos para alterar los reflejos de la vejiga (estimulacin nerviosa elctrica). En el caso de las [place], [place] un dispositivo mdico para evitar prdidas de [place]. Este es un dispositivo pequeo y Equities trader, parecido a un tampn, que se inserta en la uretra. Inyectar colgeno o perlas de carbono (espesantes) en el esfnter urinario. Esto puede ayudar a espesar el tejido y cerrar la abertura de la vejiga. Ciruga. Siga estas indicaciones en su casa: Estilo de vida Limite el consumo de alcohol y cafena. Estos pueden llenar la vejiga rpidamente e irritarla. Mantenga su higiene personal para evitar malos olores y lesiones cutneas. Pregntele al mdico acerca de cremas y productos para la piel que puedan proteger la piel de la [place]. Considere usar compresas o paales para adultos. Asegrese de cambiarlos con regularidad y siempre cmbielos tras un episodio de incontinencia. Instrucciones generales [employer] de venta libre y los recetados solamente como se lo haya indicado el mdico. Trate de ir al bao cada 3 o 4 horas, aunque no sienta la necesidad de Geographical information systems officer. Tmese el tiempo para vaciar la vejiga  completamente cada vez que orine. Despus de orinar, espere un minuto. Luego trate de orinar nuevamente. Adopte una posicin cmoda para orinar. Si la causa de su incontinencia son problemas nerviosos, lleve un registro de los medicamentos que toma y de las veces que vaya al bao. Concurra a todas las visitas de [address] se lo haya indicado el mdico. Esto es importante. Comunquese con un mdico si: Siente un dolor que empeora. La incontinencia empeora. Solicite ayuda de inmediato si: Tiene fiebre o siente escalofros. No puede orinar. Tiene enrojecimiento en la zona de la ingle o en las piernas. Resumen La incontinencia urinaria es una afeccin en la cual una persona no puede controlar dnde y IT sales professional. Esta afeccin puede ser causada por medicamentos, infecciones, debilidad en los msculos de la vejiga, debilidad en los msculos del suelo plvico, agrandamiento de la prstata (en los hombres) o [place]. Los siguientes factores aumentan el riesgo de tener esta afeccin: Oley Balm, obesidad, [place] y [place], [place], enfermedades neurolgicas y tos crnica. Hay muchos tipos de incontinencia urinaria. Estos incluyen incontinencia imperiosa, incontinencia urinaria de esfuerzo, incontinencia por rebosamiento e incontinencia funcional. Por lo general, esta afeccin se trata primero con cambios en el estilo de vida y el comportamiento, como dejar de fumar, adoptar una alimentacin saludable y Radio producer ejercicios para el suelo plvico con regularidad. Otras opciones de tratamiento incluyen medicamentos, espesantes, dispositivos mdicos, neuroestimulacin elctrica o ciruga. Esta informacin no tiene Theme park manager el consejo del mdico. Asegrese de hacerle al mdico cualquier pregunta que tenga. Document Revised: 06/14/2017 Document Reviewed: 01/08/2017 Elsevier Patient Education  2021 Elsevier Inc.  Venas varicosas Varicose Veins Las venas varicosas son venas que se  han  agrandado y [place], y se han tornado sinuosas. Suelen aparecer en las piernas. Cules son las causas? La causa de esta afeccin es el dao en las vlvulas de la vena. Estas vlvulas ayudan a que la sangre regrese al [employer]. Cuando estn daadas y dejan de funcionar correctamente, la sangre puede ir en direccin inversa y Engineer, maintenance a las venas cerca de la piel, lo que hace que las venas se agranden y se vean sinuosas. La afeccin puede surgir por cualquier factor que haga que la sangre retorne, como un [place], una actividad que obligue a estar de pie de forma prolongada o la obesidad. Qu incrementa el riesgo? Es ms probable que Dietitian en las personas con estas caractersticas: Building services engineer de Veterinary surgeon. Estn embarazadas. Tienen sobrepeso. Cules son los signos o sntomas? Los sntomas de esta afeccin incluyen: Venas abultadas, sinuosas y [place]. Sensacin de pesadez. Estos sntomas pueden empeorar hacia el final del da. Dolor en las piernas. Estos sntomas pueden empeorar hacia el final del da. Hinchazn en la pierna. Cambios en el color de la piel que est [employer]. Cmo se diagnostica? Esta afeccin se puede diagnosticar en funcin de los sntomas, un examen fsico y Sherlyn Lees. Cmo se trata? El tratamiento de esta afeccin puede incluir lo siguiente: Careers information officer sentado o de pie en la misma posicin durante mucho tiempo. Usar medias de compresin. Estas medias ayudan a Transport planner formacin de cogulos de [place] y a reducir la hinchazn de las piernas. Levantar (elevar) las piernas al descansar. Bajar de [place]. Hacer actividad fsica con regularidad. Si tiene sntomas persistentes o desea mejorar el aspecto de las venas varicosas, puede optar por un procedimiento para anular dichas venas o extraerlas. Entre los tratamientos para anular las venas, se incluyen los siguientes: Regulatory affairs officer. En este tratamiento, se inyecta una solucin en  la vena para anularla. Tratamiento con lser. En este tratamiento, la vena se calienta con un lser para anularla. Ablacin venosa por radiofrecuencia. En este tratamiento, se [place] una corriente elctrica que se produce mediante ondas de radio para anular la vena. Entre los tratamientos para extraer las venas, se incluyen los siguientes: Flebectoma. En [employer], las venas se extraen a travs de pequeas incisiones que se realizan por encima de las venas. Ligadura venosa y varicectoma. En este tratamiento, se realizan incisiones por encima de las venas. Luego, las venas se extraen despus de atarse (ligarse) con puntos (suturas). Siga estas instrucciones en su casa: Actividad Cam[place] todo lo que pueda. Caminar aumenta el flujo sanguneo. Esto ayuda a que la sangre regrese al corazn y Chief Executive Officer la presin en las venas. Tambin aumenta la fuerza cardiovascular. Siga las instrucciones del mdico con respecto al ejercicio. No permanezca de pie o sentado en una misma posicin [employer]. No se siente con las piernas cruzadas. Descanse con las piernas Conservator, museum/gallery. Indicaciones generales  Siga las instrucciones del mdico en lo que respecta a la dieta. Use medias de compresin como se lo haya indicado su mdico. No use otra clase de vestimenta ajustada alrededor de las piernas, la pelvis o la cintura. De noche, eleve las piernas a una altura superior al nivel del corazn. Si se corta en la piel que est por encima de una vena varicosa y esta sangra: Recustese con la pierna levantada. Coloque un pao limpio sobre el corte y presione firmemente sobre l hasta que el sangrado se [place]. Aplique una venda (vendaje) sobre el corte. Comunquese con  un mdico si: La piel alrededor de las venas varicosas empieza a Lobbyist. Siente dolor o tiene enrojecimiento, sensibilidad o hinchazn dura sobre la vena. Est incmodo debido al dolor. Se corta en la piel de encima de una  vena varicosa y no deja de Geophysicist/field seismologist. Resumen Las venas varicosas son venas que se han agrandado y [place], y se han tornado sinuosas. Suelen aparecer en las piernas. La causa de esta afeccin es el dao en las vlvulas de la vena. Estas vlvulas ayudan a que la sangre regrese al [employer]. El tratamiento de esta afeccin incluye hacer movimientos frecuentes, usar medias de compresin, perder peso y Radio producer ejercicios de forma regular. En algunos casos, se realizan procedimientos que anulan o extraen las venas. El tratamiento de esta afeccin puede incluir usar medias de compresin, [employer] piernas, perder peso y Education officer, environmental actividades de forma regular. En algunos casos, se realizan procedimientos que anulan o extraen las venas. Esta informacin no tiene Theme park manager el consejo del mdico. Asegrese de hacerle al mdico cualquier pregunta que tenga. Document Revised: 12/25/2019 Document Reviewed: 12/25/2019 Elsevier Patient Education  2022 [employer].

## 2021-02-28 NOTE — Progress Notes (Signed)
Patient reports pain in the throat for the past 2 years with recent white mucous and blood in the morning after brushing her teeth and rinsing her mouth. Patient has eaten today and patient has not taken medication today. Patient reports burning in the throat early in the morning. Patient reports urination frequency after drinking bottles of water, patient denies interruption in flow or extreme thirst.

## 2021-02-28 NOTE — Progress Notes (Signed)
New Patient Office Visit  Subjective:  Patient ID: Kim Cain, female    DOB: Apr 10, 1966  Age: 55 y.o. MRN: [id number]  CC:  Chief Complaint  Patient presents with   throat discomfort      HPI [facility] Kim Cain reports that she has been having burning in her throat on an everyday basis for the past two years,  worse in the morning. States that she recently started having bleeding and mucus in the morning, after rising, does not think it is coming from her gums, states that this happens every morning.  States that she did go to the dentist approximately 6 months ago and states that they told her her bleeding was not coming from a dental issue.  Also endorses a sour taste in mouth in morning as well.  Denies heartburn; NSAID use ; no cough   Tried sodium bicarb without relief  States that she has to urinate soon after drinking any water, does endorse occasional leakage; was told in [place] that she may need to have surgery to tack her bladder.  Reports that this has been ongoing for several years. No previous hysterectomy, no longer is having menses; 7 vaginal deliveries.  Denies dysuria, suprapubic pain  Reports that she has varicose veins on both ankles, states it is worse on the left.  Reports that she will have some discomfort, worse at night, has not tried anything for relief.  Reports that they have been present for several years.  Due to language barrier, an interpreter was present during the history-taking and subsequent discussion (and for part of the physical exam) with this patient.   Past Medical History:  Diagnosis Date   Distal radius fracture, left     Past Surgical History:  Procedure Laterality Date   ORIF WRIST FRACTURE Left 01/10/2016   Procedure: OPEN REDUCTION INTERNAL FIXATION (ORIF) LEFT WRIST FRACTURE;  Surgeon: Sheral Apley, MD;  Location: [facility];  Service: Orthopedics;  Laterality: Left;    TUBAL LIGATION      Family History  Problem Relation Age of Onset   Diabetes Sister    Hypertension Sister    Heart disease Sister    Leukemia Paternal Aunt    Leukemia Maternal Aunt     Social History   Socioeconomic History   Marital status: Married    Spouse name: Not on file   Number of children: Not on file   Years of education: Not on file   Highest education level: Not on file  Occupational History   Not on file  Tobacco Use   Smoking status: Former   Smokeless tobacco: Never  Substance and Sexual Activity   Alcohol use: Yes    Comment: social   Drug use: No   Sexual activity: Not Currently  Other Topics Concern   Not on file  Social History Narrative   Not on file   Social Determinants of Health   Financial Resource Strain: Not on file  Food Insecurity: Not on file  Transportation Needs: Not on file  Physical Activity: Not on file  Stress: Not on file  Social Connections: Not on file  Intimate Partner Violence: Not on file    ROS Review of Systems  Constitutional:  Negative for chills and fever.  HENT:  Positive for sore throat. Negative for congestion, postnasal drip, sinus pressure and trouble swallowing.   Eyes: Negative.   Respiratory:  Negative for cough and shortness of breath.  Cardiovascular:  Negative for chest pain and leg swelling.  Gastrointestinal:  Negative for abdominal pain, nausea and vomiting.  Endocrine: Positive for polyuria.  Genitourinary:  Positive for frequency. Negative for dysuria, hematuria and pelvic pain.  Musculoskeletal: Negative.   Allergic/Immunologic: Negative.   Neurological: Negative.   Hematological: Negative.   Psychiatric/Behavioral: Negative.     Objective:   Today's Vitals: BP 125/71 (BP Location: Left Arm, Patient Position: Sitting, Cuff Size: Normal)   Pulse 81   Temp 98.2 F (36.8 C) (Oral)   Resp 18   Ht 4\' 11"  (1.499 m)   Wt 178 lb (80.7 kg)   LMP 01/04/2016   HC 9" (22.9 cm)   SpO2 96%    BMI 35.95 kg/m   Physical Exam Vitals and nursing note reviewed.  Constitutional:      Appearance: Normal appearance.  HENT:     Head: Normocephalic and atraumatic.     Salivary Glands: Right salivary gland is not diffusely enlarged. Left salivary gland is not diffusely enlarged.     Right Ear: Ear canal and external ear normal.     Left Ear: Tympanic membrane, ear canal and external ear normal.     Nose: Nose normal.     Right Sinus: No maxillary sinus tenderness or frontal sinus tenderness.     Left Sinus: No maxillary sinus tenderness or frontal sinus tenderness.     Mouth/Throat:     Lips: Pink.     Mouth: Mucous membranes are moist.     Dentition: No gingival swelling or gum lesions.     Tongue: No lesions.     Pharynx: Oropharynx is clear. No posterior oropharyngeal erythema.  Cardiovascular:     Rate and Rhythm: Normal rate and regular rhythm.     Pulses: Normal pulses.     Heart sounds: Murmur heard.     Comments: Small varicose veins noted on both ankles Pulmonary:     Effort: Pulmonary effort is normal.     Breath sounds: Normal breath sounds.  Musculoskeletal:        General: Normal range of motion.     Cervical back: Normal range of motion and neck supple.     Right lower leg: No edema.     Left lower leg: No edema.  Lymphadenopathy:     Cervical: No cervical adenopathy.  Skin:    General: Skin is warm and dry.  Neurological:     General: No focal deficit present.     Mental Status: She is alert and oriented to person, place, and time.  Psychiatric:        Mood and Affect: Mood normal.        Behavior: Behavior normal.        Thought Content: Thought content normal.        Judgment: Judgment normal.    Assessment & Plan:   Problem List Items Addressed This Visit       Cardiovascular and Mediastinum   Varicose veins of bilateral lower extremities with other complications     Other   Throat discomfort - Primary   Relevant Medications   omeprazole  (PRILOSEC) 20 MG capsule   cetirizine (ZYRTEC ALLERGY) 10 MG tablet   Absence of bladder continence   Other Visit Diagnoses     Screening, lipid       Relevant Orders   Lipid panel   Screening for metabolic disorder       Relevant Orders   CBC with Differential/Platelet  Comp. Metabolic Panel (12)   TSH   Screening for HIV (human immunodeficiency virus)       Relevant Orders   HIV antibody (with reflex)   Encounter for HCV screening test for low risk patient       Relevant Orders   HCV Ab w Reflex to Quant PCR       Outpatient Encounter Medications as of 02/28/2021  Medication Sig   cetirizine (ZYRTEC ALLERGY) 10 MG tablet Take 1 tablet (10 mg total) by mouth daily.   omeprazole (PRILOSEC) 20 MG capsule Take 1 capsule (20 mg total) by mouth daily.   Multiple Vitamin (MULTIVITAMIN WITH MINERALS) TABS tablet Take 1 tablet by mouth daily. (Patient not taking: Reported on 02/28/2021)   [DISCONTINUED] docusate sodium (COLACE) 100 MG capsule Take 1 capsule (100 mg total) by mouth 2 (two) times daily. (Patient not taking: Reported on 02/28/2021)   [DISCONTINUED] HYDROcodone-acetaminophen (NORCO) 5-325 MG tablet Take 1-2 tablets by mouth every 4 (four) hours as needed for moderate pain.   [DISCONTINUED] ondansetron (ZOFRAN) 4 MG tablet Take 1 tablet (4 mg total) by mouth every 8 (eight) hours as needed for nausea or vomiting.   No facility-administered encounter medications on file as of 02/28/2021.  1. Throat discomfort Bleeding occurs in the morning, is described as very light bleeding, no cough or clearing of throat involved.  Possible acid reflux symptoms with sour mouth.  Possible irritation from seasonal allergies.  Trial of omeprazole, Zyrtec.  Appointment made for patient [facility].  Red flags given for prompt reevaluation  Patient has upcoming appointment [facility]  November 3 with Dr. Delford Field.  Patient already has information on [place] financial assistance.  Patient to return to mobile unit tomorrow morning for fasting labs - omeprazole (PRILOSEC) 20 MG capsule; Take 1 capsule (20 mg total) by mouth daily.  Dispense: 30 capsule; Refill: 3 - cetirizine (ZYRTEC ALLERGY) 10 MG tablet; Take 1 tablet (10 mg total) by mouth daily.  Dispense: 30 tablet; Refill: 11  2. Varicose veins of bilateral lower extremities with other complications Patient education given on supportive care, trial of conservative treatment   3. Urinary incontinence Patient education given on supportive care, trial conservative treatment  4. Screening, lipid  - Lipid panel; Future  5.  Screening for metabolic disorder  - CBC with Differential/Platelet; Future - Comp. Metabolic Panel (12); Future - TSH; Future  6. Screening for HIV (human immunodeficiency virus)  - HIV antibody (with reflex); Future  7.  Encounter for HCV screening test for low risk patient  - HCV Ab w Reflex to Quant PCR; Future   I have reviewed the patient's medical history (PMH, PSH, Social History, Family History, Medications, and allergies) , and have been updated if relevant. I spent 40 minutes reviewing chart and  face to face time with patient.     Follow-up: Return in about 1 day (around 03/01/2021) for Fasting  labs.   Kasandra Knudsen Mayers, PA-C

## 2021-03-01 ENCOUNTER — Ambulatory Visit: Payer: Self-pay | Admitting: *Deleted

## 2021-03-01 DIAGNOSIS — Z114 Encounter for screening for human immunodeficiency virus [HIV]: Secondary | ICD-10-CM

## 2021-03-01 DIAGNOSIS — Z13220 Encounter for screening for lipoid disorders: Secondary | ICD-10-CM

## 2021-03-01 DIAGNOSIS — Z13228 Encounter for screening for other metabolic disorders: Secondary | ICD-10-CM

## 2021-03-01 DIAGNOSIS — Z1159 Encounter for screening for other viral diseases: Secondary | ICD-10-CM

## 2021-03-01 NOTE — Progress Notes (Signed)
Patient tolerated lab draw well today in the right arm antecubital space with 1 successful stick.

## 2021-03-01 NOTE — Patient Instructions (Signed)
Patient is aware of receiving a follow up phone call Monday with an interpreter regarding her results.

## 2021-03-06 ENCOUNTER — Ambulatory Visit: Payer: Self-pay | Attending: Family Medicine

## 2021-03-06 ENCOUNTER — Other Ambulatory Visit: Payer: Self-pay

## 2021-03-08 LAB — LIPID PANEL
Chol/HDL Ratio: 5.1 ratio — ABNORMAL HIGH (ref 0.0–4.4)
Cholesterol, Total: 256 mg/dL — ABNORMAL HIGH (ref 100–199)
HDL: 50 mg/dL (ref >39)
LDL Chol Calc (NIH): 190 mg/dL — ABNORMAL HIGH (ref 0–99)
Triglycerides: 92 mg/dL (ref 0–149)
VLDL Cholesterol Cal: 16 mg/dL (ref 5–40)

## 2021-03-08 LAB — HCV AB W REFLEX TO QUANT PCR: HCV Ab: <0.1 s/co ratio (ref 0.0–0.9)

## 2021-03-08 LAB — COMP. METABOLIC PANEL (12)
AST: 22 IU/L (ref 0–40)
Albumin/Globulin Ratio: 1.7 (ref 1.2–2.2)
Albumin: 4.6 g/dL (ref 3.8–4.9)
Alkaline Phosphatase: 75 IU/L (ref 44–121)
BUN/Creatinine Ratio: 26 — ABNORMAL HIGH (ref 9–23)
BUN: 15 mg/dL (ref 6–24)
Bilirubin Total: 0.3 mg/dL (ref 0.0–1.2)
Calcium: 8.7 mg/dL (ref 8.7–10.2)
Chloride: 103 mmol/L (ref 96–106)
Creatinine, Ser: 0.58 mg/dL (ref 0.57–1.00)
Globulin, Total: 2.7 g/dL (ref 1.5–4.5)
Glucose: 96 mg/dL (ref 65–99)
Potassium: 4.4 mmol/L (ref 3.5–5.2)
Sodium: 139 mmol/L (ref 134–144)
Total Protein: 7.3 g/dL (ref 6.0–8.5)
eGFR: 108 mL/min/{1.73_m2} (ref >59)

## 2021-03-08 LAB — HIV ANTIBODY (ROUTINE TESTING W REFLEX): HIV Screen 4th Generation wRfx: NONREACTIVE

## 2021-03-08 LAB — CBC WITH DIFFERENTIAL/PLATELET
Basophils Absolute: 0 10*3/uL (ref 0.0–0.2)
Basos: 1 %
EOS (ABSOLUTE): 0.1 10*3/uL (ref 0.0–0.4)
Eos: 2 %
Hematocrit: 44.1 % (ref 34.0–46.6)
Hemoglobin: 14.6 g/dL (ref 11.1–15.9)
Immature Grans (Abs): 0 10*3/uL (ref 0.0–0.1)
Immature Granulocytes: 0 %
Lymphocytes Absolute: 1.8 10*3/uL (ref 0.7–3.1)
Lymphs: 31 %
MCH: 28.8 pg (ref 26.6–33.0)
MCHC: 33.1 g/dL (ref 31.5–35.7)
MCV: 87 fL (ref 79–97)
Monocytes Absolute: 0.3 10*3/uL (ref 0.1–0.9)
Monocytes: 6 %
Neutrophils Absolute: 3.6 10*3/uL (ref 1.4–7.0)
Neutrophils: 60 %
Platelets: 212 10*3/uL (ref 150–450)
RBC: 5.07 x10E6/uL (ref 3.77–5.28)
RDW: 13.6 % (ref 11.7–15.4)
WBC: 5.9 10*3/uL (ref 3.4–10.8)

## 2021-03-08 LAB — HCV INTERPRETATION

## 2021-03-08 LAB — TSH: TSH: 1.53 u[IU]/mL (ref 0.450–4.500)

## 2021-03-20 ENCOUNTER — Telehealth: Payer: Self-pay | Admitting: *Deleted

## 2021-03-20 NOTE — Telephone Encounter (Signed)
-----   Message from Roney Jaffe, [place] sent at 03/08/2021  2:14 PM EDT ----- Please call patient and let her know that her thyroid function, kidney and liver function are within normal limits.  She does not show signs of anemia.  Her screening for hepatitis C and HIV were negative.  Her cholesterol is elevated, her risk of a cardiovascular event in the next 10 years is 2%.  She does not need to start medication at this time, but I strongly encourage her to follow a low-cholesterol diet and have her cholesterol rechecked in 6 months to 1 year.  The 10-year ASCVD risk score (Arnett DK, et al., 2019) is: 2.3%   Values used to calculate the score:     Age: 55 years     Sex: Female     Is Non-Hispanic African American: No     Diabetic: No     Tobacco smoker: No     Systolic Blood Pressure: 125 mmHg     Is BP treated: No     HDL Cholesterol: 50 mg/dL     Total Cholesterol: 256 mg/dL

## 2021-03-20 NOTE — Telephone Encounter (Signed)
Medical Assistant used [employer] to contact patient.  Interpreter Name: Tammy Sours #: [id number] Patient verified DOB Patient is aware of labs being normal and needing to follow a low cholesterol diet with a recheck in 6 mths to 1 year.

## 2021-03-22 ENCOUNTER — Telehealth: Payer: Self-pay

## 2021-03-22 NOTE — Telephone Encounter (Signed)
Pt was sent a letter from financial dept. Inform them, that the application they submitted was incomplete, since they were missing some documentation at the time of the appointment, Pt need to reschedule and resubmit all new papers and application for CAFA and [place], P.S. old documents has been sent back by mail to the Pt and Pt. need to make a new appt. 

## 2021-04-10 ENCOUNTER — Other Ambulatory Visit: Payer: Self-pay

## 2021-04-10 ENCOUNTER — Ambulatory Visit: Payer: Self-pay | Attending: Family Medicine

## 2021-04-20 ENCOUNTER — Encounter: Payer: Self-pay | Admitting: Critical Care Medicine

## 2021-04-20 ENCOUNTER — Other Ambulatory Visit: Payer: Self-pay

## 2021-04-20 ENCOUNTER — Ambulatory Visit: Payer: Self-pay | Attending: Critical Care Medicine | Admitting: Critical Care Medicine

## 2021-04-20 VITALS — BP 106/70 | HR 73 | Resp 16 | Ht 60.0 in | Wt 179.0 lb

## 2021-04-20 DIAGNOSIS — Z23 Encounter for immunization: Secondary | ICD-10-CM

## 2021-04-20 DIAGNOSIS — K0510 Chronic gingivitis, plaque induced: Secondary | ICD-10-CM

## 2021-04-20 DIAGNOSIS — Z1211 Encounter for screening for malignant neoplasm of colon: Secondary | ICD-10-CM

## 2021-04-20 DIAGNOSIS — Z124 Encounter for screening for malignant neoplasm of cervix: Secondary | ICD-10-CM

## 2021-04-20 DIAGNOSIS — E782 Mixed hyperlipidemia: Secondary | ICD-10-CM

## 2021-04-20 DIAGNOSIS — I83893 Varicose veins of bilateral lower extremities with other complications: Secondary | ICD-10-CM

## 2021-04-20 DIAGNOSIS — N393 Stress incontinence (female) (male): Secondary | ICD-10-CM

## 2021-04-20 DIAGNOSIS — Z550 Illiteracy and low-level literacy: Secondary | ICD-10-CM

## 2021-04-20 MED ORDER — ATORVASTATIN CALCIUM 10 MG PO TABS
10.0000 mg | ORAL_TABLET | Freq: Every day | ORAL | 3 refills | Status: DC
  Filled 2021-04-20: qty 30, 30d supply, fill #0

## 2021-04-20 NOTE — Progress Notes (Signed)
New Patient Office Visit  Subjective:  Patient ID: Kim Cain Drucie Opitz, female    DOB: [birth year]  Age: 55 y.o. MRN: [id number]  CC:  Chief Complaint  Patient presents with   Establish Care    HPI [facility] Nephew [name] presents for primary care to establish.  This patient's visit was assisted by both video interpreter Geo [phone] and in person interpreter Alba for Spanish  Note the patient comes by herself and does reveal that she is illiterate and cannot read any written words in Spanish she can understand numbers  Patient was seen in the mobile medicine unit previously in September for throat pain and acid in the throat she was given omeprazole she states has not done much.  She notes severe inflammation around the gum area and is interested in seeing a dentist.  Also she has stress urinary incontinence has been progressive.  She is nulliparous and having had 6 children.  She is now postmenopausal.  Patient does need a Pap smear.  Patient also needs colon cancer screening.  Patient's had 3 COVID vaccines in 2021 to baseline vaccines and 1 booster but she cannot member the name of the vaccine she received.  On arrival blood pressure 106/70.  Note at [facility] visit the patient's health screening labs were normal with exception of elevated LDL but is at a significant level  Patient initially declined flu vaccine but then changed her mind and will accept it.  Patient also complains of varicose veins of left lower extremity.  She works in a job she standing all day long.  She has no other complaints.  Patient is also due a mammogram  Past Medical History:  Diagnosis Date   Distal radius fracture, left     Past Surgical History:  Procedure Laterality Date   ORIF WRIST FRACTURE Left 01/10/2016   Procedure: OPEN REDUCTION INTERNAL FIXATION (ORIF) LEFT WRIST FRACTURE;  Surgeon: Sheral Apley, MD;  Location: [facility];  Service: Orthopedics;   Laterality: Left;   TUBAL LIGATION      Family History  Problem Relation Age of Onset   Diabetes Sister    Hypertension Sister    Heart disease Sister    Leukemia Paternal Aunt    Leukemia Maternal Aunt     Social History   Socioeconomic History   Marital status: Married    Spouse name: Not on file   Number of children: Not on file   Years of education: Not on file   Highest education level: Not on file  Occupational History   Not on file  Tobacco Use   Smoking status: Former   Smokeless tobacco: Never  Substance and Sexual Activity   Alcohol use: Yes    Comment: social   Drug use: No   Sexual activity: Not Currently  Other Topics Concern   Not on file  Social History Narrative   Not on file   Social Determinants of Health   Financial Resource Strain: Not on file  Food Insecurity: Not on file  Transportation Needs: Not on file  Physical Activity: Not on file  Stress: Not on file  Social Connections: Not on file  Intimate Partner Violence: Not on file    ROS Review of Systems  Constitutional: Negative.   HENT:  Positive for dental problem. Negative for ear pain, postnasal drip, rhinorrhea, sinus pressure, sore throat, trouble swallowing and voice change.   Eyes: Negative.   Respiratory: Negative.  Negative for apnea, cough,  choking, chest tightness, shortness of breath, wheezing and stridor.   Cardiovascular: Negative.  Negative for chest pain, palpitations and leg swelling.  Gastrointestinal: Negative.  Negative for abdominal distention, abdominal pain, nausea and vomiting.       Gerd, acid in mouth  Genitourinary: Negative.        Incontinence  Musculoskeletal: Negative.  Negative for arthralgias and myalgias.  Skin:  Positive for rash.  Allergic/Immunologic: Negative.  Negative for environmental allergies and food allergies.  Neurological:  Positive for dizziness and light-headedness. Negative for seizures, syncope, weakness and headaches.   Hematological: Negative.  Negative for adenopathy. Does not bruise/bleed easily.  Psychiatric/Behavioral: Negative.  Negative for agitation, dysphoric mood, sleep disturbance and suicidal ideas. The patient is not nervous/anxious.    Objective:   Today's Vitals: BP 106/70   Pulse 73   Resp 16   Ht 5' (1.524 m)   Wt 179 lb (81.2 kg)   LMP 01/04/2016   SpO2 95%   BMI 34.96 kg/m   Physical Exam Vitals reviewed.  Constitutional:      Appearance: Normal appearance. She is well-developed. She is obese. She is not diaphoretic.  HENT:     Head: Normocephalic and atraumatic.     Nose: Nose normal. No nasal deformity, septal deviation, mucosal edema or rhinorrhea.     Right Sinus: No maxillary sinus tenderness or frontal sinus tenderness.     Left Sinus: No maxillary sinus tenderness or frontal sinus tenderness.     Mouth/Throat:     Mouth: Mucous membranes are moist.     Pharynx: Oropharynx is clear. No oropharyngeal exudate.     Comments: Severe gingivitis seen in both upper and lower jaw multiple caries seen as well Eyes:     General: No scleral icterus.    Conjunctiva/sclera: Conjunctivae normal.     Pupils: Pupils are equal, round, and reactive to light.  Neck:     Thyroid: No thyromegaly.     Vascular: No carotid bruit or JVD.     Trachea: Trachea normal. No tracheal tenderness or tracheal deviation.  Cardiovascular:     Rate and Rhythm: Normal rate and regular rhythm.     Chest Wall: PMI is not displaced.     Pulses: Normal pulses. No decreased pulses.     Heart sounds: Normal heart sounds, S1 normal and S2 normal. Heart sounds not distant. No murmur heard. No systolic murmur is present.  No diastolic murmur is present.    No friction rub. No gallop. No S3 or S4 sounds.  Pulmonary:     Effort: No tachypnea, accessory muscle usage or respiratory distress.     Breath sounds: No stridor. No decreased breath sounds, wheezing, rhonchi or rales.  Chest:     Chest wall: No  tenderness.  Abdominal:     General: Bowel sounds are normal. There is no distension.     Palpations: Abdomen is soft. Abdomen is not rigid.     Tenderness: There is no abdominal tenderness. There is no guarding or rebound.  Musculoskeletal:        General: Normal range of motion.     Cervical back: Normal range of motion and neck supple. No edema, erythema or rigidity. No muscular tenderness. Normal range of motion.  Lymphadenopathy:     Head:     Right side of head: No submental or submandibular adenopathy.     Left side of head: No submental or submandibular adenopathy.     Cervical: No cervical adenopathy.  Skin:    General: Skin is warm and dry.     Coloration: Skin is not pale.     Findings: No rash.     Nails: There is no clubbing.     Comments: Varicose veins seen left ankle greater than right ankle no evidence of inflammation or phlebitis or cellulitis    Neurological:     Mental Status: She is alert and oriented to person, place, and time.     Sensory: No sensory deficit.  Psychiatric:        Mood and Affect: Mood normal.        Speech: Speech normal.        Behavior: Behavior normal.        Thought Content: Thought content normal.        Judgment: Judgment normal.    Assessment & Plan:   Problem List Items Addressed This Visit       Cardiovascular and Mediastinum   Varicose veins of bilateral lower extremities with other complications    Bilateral varicose veins and lower extremities without evidence of phlebitis or cellulitis  Patient given instructions to obtain medium level of compression stockings up to the knees and wear these while at work she can get these over-the-counter at any drugstore      Relevant Medications   atorvastatin (LIPITOR) 10 MG tablet     Digestive   Gingivitis    Significant gingivitis  Plan to refer to dental care she does have the orange card      Relevant Orders   Ambulatory referral to Dentistry     Other   Absence of  bladder continence - Primary    Stress urinary incontinence with likely pelvic floor dysfunction  Referral to gynecology made along with need for Pap smear      Relevant Orders   Ambulatory referral to Gynecology   Illiterate    Patient is illiterate this will impact her health outcomes she was told to bring a family member who can read Spanish with her at next visit and having an in person interpreter also will help      Mixed hyperlipidemia    Hyperlipidemia on recent health screening labs  Begin atorvastatin 10 mg daily note liver function was normal      Relevant Medications   atorvastatin (LIPITOR) 10 MG tablet   Other Visit Diagnoses     Colon cancer screening       Relevant Orders   Fecal occult blood, imunochemical   Cervical cancer screening       Relevant Orders   Ambulatory referral to Gynecology   Need for immunization against influenza       Relevant Orders   Flu Vaccine QUAD 28mo+IM (Fluarix, Fluzone & Alfiuria Quad PF) (Completed)       Outpatient Encounter Medications as of 04/20/2021  Medication Sig   atorvastatin (LIPITOR) 10 MG tablet Take 1 tablet (10 mg total) by mouth daily.   cetirizine (ZYRTEC ALLERGY) 10 MG tablet Take 1 tablet (10 mg total) by mouth daily.   Multiple Vitamin (MULTIVITAMIN WITH MINERALS) TABS tablet Take 1 tablet by mouth daily.   [DISCONTINUED] omeprazole (PRILOSEC) 20 MG capsule Take 1 capsule (20 mg total) by mouth daily.   No facility-administered encounter medications on file as of 04/20/2021.   Patient will have a mammogram through the scholarship program and she did receive a flu vaccine at this visit Follow-up: Return in about 4 months (around 08/18/2021).   Shan Levans,  MD

## 2021-04-20 NOTE — Assessment & Plan Note (Signed)
Patient is illiterate this will impact her health outcomes she was told to bring a family member who can read Spanish with her at next visit and having an in person interpreter also will help

## 2021-04-20 NOTE — Patient Instructions (Signed)
Flu vaccine was given  Call the cancer control program to apply for free mammogram , they will order the studies once approved The number is:   [phone]   Referral to gynecology will be made for your incontinence and for Pap smear  Referral to dentistry made for the acid in the mouth and dental pain  Obtain a compression stocking that we will fit your both your legs going to the knees moderate compression you can go to any drugstore for this wear this while working  Colon cancer screening kit will be issued please process bring back to the lab  Begin atorvastatin 1 daily for elevated cholesterol  For your skin condition please begin a women's over 50 multivitamin I recommend Centrum it has all the vitamins you need that will help your skin and nails  Return to see Dr. Joya Gaskins 4 months  Se administr la vacuna contra la influenza  Llame al programa de control del cncer para solicitar una mamografa gratuita, ellos ordenar los estudios una vez aprobados [place]ue es: [phone]  Se realizar una derivacin a ginecologa para su incontinencia y para Ardelia Mems prueba de Papanicolaou.  Derivacin a odontologa realizada por el cido en la boca y Product manager unas medias de compresin que le calzaremos en ambas piernas hasta las rodillas compresin moderada puede ir a cualquier farmacia para que las use mientras trabaja  Se Special educational needs teacher un kit de deteccin de cncer de colon, procese y devulvalo al laboratorio.  Comenzar atorvastatina 1 da para colesterol elevado  Para la condicin de su piel, comience con un multivitamnico para mujeres mayores de 50. Le recomiendo Centrum, tiene todas las vitaminas que necesita que ayudarn a su piel y uas.  Volver a ver al Dr. Joya Gaskins 4 meses

## 2021-04-20 NOTE — Assessment & Plan Note (Signed)
Stress urinary incontinence with likely pelvic floor dysfunction  Referral to gynecology made along with need for Pap smear

## 2021-04-20 NOTE — Assessment & Plan Note (Signed)
Significant gingivitis  Plan to refer to dental care she does have the orange card

## 2021-04-20 NOTE — Assessment & Plan Note (Signed)
Bilateral varicose veins and lower extremities without evidence of phlebitis or cellulitis  Patient given instructions to obtain medium level of compression stockings up to the knees and wear these while at work she can get these over-the-counter at any drugstore

## 2021-04-20 NOTE — Assessment & Plan Note (Signed)
Hyperlipidemia on recent health screening labs  Begin atorvastatin 10 mg daily note liver function was normal

## 2021-04-26 ENCOUNTER — Telehealth: Payer: Self-pay

## 2021-04-26 LAB — FECAL OCCULT BLOOD, IMMUNOCHEMICAL: Fecal Occult Bld: NEGATIVE

## 2021-04-26 NOTE — Telephone Encounter (Signed)
-----   Message from Storm Frisk, MD sent at 04/26/2021  8:20 AM EST ----- Let pt know colon cancer screen NEG  repeat one year

## 2021-04-26 NOTE — Telephone Encounter (Signed)
Pt was called and is aware of results, DOB was confirmed    Interpreter:Noe Number: [id number] 

## 2021-04-26 NOTE — Telephone Encounter (Signed)
Pt was called and is aware of results, DOB was confirmed    Interpreter:Noe Number: [id number]

## 2021-04-27 ENCOUNTER — Other Ambulatory Visit: Payer: Self-pay

## 2021-05-19 ENCOUNTER — Ambulatory Visit: Payer: Self-pay | Admitting: Otolaryngology

## 2021-08-20 NOTE — Progress Notes (Signed)
Established Patient Office Visit  Subjective:  Patient ID: Kim Cain, female    DOB: Oct 18, 1965  Age: 56 y.o. MRN: [id number]  CC: Primary care follow-up  HPI [facility] Kim Cain presents for primary care follow-up.   This patient was previously seen in November 2022 to establish.  Since that time the patient's urinary frequency has resolved.  She does complain of some dark spots on the dorsum of both hands and forearms.  She also would like to have her iron levels checked.  Note today the patient's Spanish interpretation was provided by her daughter Kim Cain The patient also needs a mammogram and Pap smear.  She is already had colon cancer screening it was negative. Patient also needs a tetanus shot but she does not have insurance and she cannot qualify for the drug companies vaccine program because she does not have a Fish farm manager number we will need to determine how to overcome this barrier as she would like to receive the tetanus vaccine. Past Medical History:  Diagnosis Date   Distal radius fracture, left     Past Surgical History:  Procedure Laterality Date   ORIF WRIST FRACTURE Left 01/10/2016   Procedure: OPEN REDUCTION INTERNAL FIXATION (ORIF) LEFT WRIST FRACTURE;  Surgeon: Renette Butters, MD;  Location: [facility];  Service: Orthopedics;  Laterality: Left;   TUBAL LIGATION      Family History  Problem Relation Age of Onset   Diabetes Sister    Hypertension Sister    Heart disease Sister    Leukemia Paternal Aunt    Leukemia Maternal Aunt     Social History   Socioeconomic History   Marital status: Married    Spouse name: Not on file   Number of children: Not on file   Years of education: Not on file   Highest education level: Not on file  Occupational History   Not on file  Tobacco Use   Smoking status: Former   Smokeless tobacco: Never  Substance and Sexual Activity   Alcohol use: Yes    Comment:  social   Drug use: No   Sexual activity: Not Currently  Other Topics Concern   Not on file  Social History Narrative   Not on file   Social Determinants of Health   Financial Resource Strain: Not on file  Food Insecurity: Not on file  Transportation Needs: Not on file  Physical Activity: Not on file  Stress: Not on file  Social Connections: Not on file  Intimate Partner Violence: Not on file    Outpatient Medications Prior to Visit  Medication Sig Dispense Refill   Multiple Vitamin (MULTIVITAMIN WITH MINERALS) TABS tablet Take 1 tablet by mouth daily. (Patient not taking: Reported on 08/22/2021)     atorvastatin (LIPITOR) 10 MG tablet Take 1 tablet (10 mg total) by mouth daily. (Patient not taking: Reported on 08/22/2021) 90 tablet 3   cetirizine (ZYRTEC ALLERGY) 10 MG tablet Take 1 tablet (10 mg total) by mouth daily. (Patient not taking: Reported on 08/22/2021) 30 tablet 11   No facility-administered medications prior to visit.    No Known Allergies  ROS Review of Systems  Constitutional:  Negative for chills, diaphoresis and fever.  HENT:  Negative for congestion, hearing loss, nosebleeds, sore throat and tinnitus.   Eyes:  Negative for photophobia and redness.  Respiratory:  Negative for cough, shortness of breath, wheezing and stridor.   Cardiovascular:  Negative for chest pain, palpitations and leg  swelling.  Gastrointestinal:  Negative for abdominal pain, blood in stool, constipation, diarrhea, nausea and vomiting.  Endocrine: Negative for polydipsia.  Genitourinary:  Negative for dysuria, flank pain, frequency, hematuria and urgency.  Musculoskeletal:  Negative for back pain, myalgias and neck pain.  Skin:  Negative for rash.       Skin spots  Allergic/Immunologic: Negative for environmental allergies.  Neurological:  Negative for dizziness, tremors, seizures, weakness and headaches.  Hematological:  Does not bruise/bleed easily.  Psychiatric/Behavioral:  Negative for  suicidal ideas. The patient is not nervous/anxious.      Objective:    Physical Exam Vitals reviewed.  Constitutional:      Appearance: Normal appearance. She is well-developed. She is not diaphoretic.  HENT:     Head: Normocephalic and atraumatic.     Nose: No nasal deformity, septal deviation, mucosal edema or rhinorrhea.     Right Sinus: No maxillary sinus tenderness or frontal sinus tenderness.     Left Sinus: No maxillary sinus tenderness or frontal sinus tenderness.     Mouth/Throat:     Pharynx: No oropharyngeal exudate.  Eyes:     General: No scleral icterus.    Conjunctiva/sclera: Conjunctivae normal.     Pupils: Pupils are equal, round, and reactive to light.  Neck:     Thyroid: No thyromegaly.     Vascular: No carotid bruit or JVD.     Trachea: Trachea normal. No tracheal tenderness or tracheal deviation.  Cardiovascular:     Rate and Rhythm: Normal rate and regular rhythm.     Chest Wall: PMI is not displaced.     Pulses: Normal pulses. No decreased pulses.     Heart sounds: Normal heart sounds, S1 normal and S2 normal. Heart sounds not distant. No murmur heard. No systolic murmur is present.  No diastolic murmur is present.    No friction rub. No gallop. No S3 or S4 sounds.  Pulmonary:     Effort: No tachypnea, accessory muscle usage or respiratory distress.     Breath sounds: No stridor. No decreased breath sounds, wheezing, rhonchi or rales.  Chest:     Chest wall: No tenderness.  Abdominal:     General: Bowel sounds are normal. There is no distension.     Palpations: Abdomen is soft. Abdomen is not rigid.     Tenderness: There is no abdominal tenderness. There is no guarding or rebound.  Musculoskeletal:        General: Normal range of motion.     Cervical back: Normal range of motion and neck supple. No edema, erythema or rigidity. No muscular tenderness. Normal range of motion.  Lymphadenopathy:     Head:     Right side of head: No submental or  submandibular adenopathy.     Left side of head: No submental or submandibular adenopathy.     Cervical: No cervical adenopathy.  Skin:    General: Skin is warm and dry.     Coloration: Skin is not pale.     Findings: Lesion present. No rash.     Nails: There is no clubbing.     Comments: Brown senile lentigos seen over both dorsums of the hand and forearms  Neurological:     Mental Status: She is alert and oriented to person, place, and time.     Sensory: No sensory deficit.  Psychiatric:        Speech: Speech normal.        Behavior: Behavior normal.  BP 120/77    Pulse 70    Wt 181 lb (82.1 kg)    LMP 01/04/2016    SpO2 94%    BMI 35.35 kg/m  Wt Readings from Last 3 Encounters:  08/22/21 181 lb (82.1 kg)  04/20/21 179 lb (81.2 kg)  02/28/21 178 lb (80.7 kg)     Health Maintenance Due  Topic Date Due   COVID-19 Vaccine (1) Never done   TETANUS/TDAP  Never done   PAP SMEAR-Modifier  10/23/2014   MAMMOGRAM  04/21/2017   Zoster Vaccines- Shingrix (1 of 2) Never done    There are no preventive care reminders to display for this patient.  Lab Results  Component Value Date   TSH 1.530 03/01/2021   Lab Results  Component Value Date   WBC 5.9 03/01/2021   HGB 14.6 03/01/2021   HCT 44.1 03/01/2021   MCV 87 03/01/2021   PLT 212 03/01/2021   Lab Results  Component Value Date   NA 139 03/01/2021   K 4.4 03/01/2021   CO2 26 11/02/2009   GLUCOSE 96 03/01/2021   BUN 15 03/01/2021   CREATININE 0.58 03/01/2021   BILITOT 0.3 03/01/2021   ALKPHOS 75 03/01/2021   AST 22 03/01/2021   ALT 37 (H) 11/01/2009   PROT 7.3 03/01/2021   ALBUMIN 4.6 03/01/2021   CALCIUM 8.7 03/01/2021   EGFR 108 03/01/2021   Lab Results  Component Value Date   CHOL 256 (H) 03/01/2021   Lab Results  Component Value Date   HDL 50 03/01/2021   Lab Results  Component Value Date   LDLCALC 190 (H) 03/01/2021   Lab Results  Component Value Date   TRIG 92 03/01/2021   Lab Results   Component Value Date   CHOLHDL 5.1 (H) 03/01/2021   No results found for: HGBA1C    Assessment & Plan:   Problem List Items Addressed This Visit       Musculoskeletal and Integument   Senile lentigo    Likely senile lentigo of the forearms we will prescribe Retin-A and asked patient to ensure use of sunscreen        Other   Mixed hyperlipidemia - Primary    Patient had yet to start cholesterol medication I refilled the atorvastatin she does have high lipid levels from the last visit      Relevant Medications   atorvastatin (LIPITOR) 10 MG tablet   Encounter for health-related screening    Will obtain health screenings including iron levels at this visit along with blood counts and metabolic panel      Relevant Orders   Iron, TIBC and Ferritin Panel   CBC with Differential/Platelet   Comprehensive metabolic panel   Encounter for screening mammogram for malignant neoplasm of breast    We will obtain patient a mammogram through the scholarship program      Relevant Orders   MM DIGITAL SCREENING BILATERAL   Need for tetanus, diphtheria, and acellular pertussis (Tdap) vaccine    This patient needs a tetanus vaccine she is uninsured and does not have a Arboriculturist which is now required by the drug company in order for this patient to receive tetanus vaccine using the patient assistance program  This is an objectionable health equity barrier we will see if we can overcome this       Meds ordered this encounter  Medications   atorvastatin (LIPITOR) 10 MG tablet    Sig: Take 1 tablet (10 mg total)  by mouth daily.    Dispense:  90 tablet    Refill:  3   tretinoin (RETIN-A) 0.025 % cream    Sig: Apply topically at bedtime to the back of the hands    Dispense:  20 g    Refill:  0    Follow-up: Return in about 4 months (around 12/22/2021).    Asencion Noble, MD

## 2021-08-22 ENCOUNTER — Ambulatory Visit: Payer: Self-pay | Attending: Critical Care Medicine | Admitting: Critical Care Medicine

## 2021-08-22 ENCOUNTER — Other Ambulatory Visit: Payer: Self-pay

## 2021-08-22 ENCOUNTER — Encounter: Payer: Self-pay | Admitting: Critical Care Medicine

## 2021-08-22 VITALS — BP 120/77 | HR 70 | Wt 181.0 lb

## 2021-08-22 DIAGNOSIS — L814 Other melanin hyperpigmentation: Secondary | ICD-10-CM

## 2021-08-22 DIAGNOSIS — Z23 Encounter for immunization: Secondary | ICD-10-CM

## 2021-08-22 DIAGNOSIS — Z1231 Encounter for screening mammogram for malignant neoplasm of breast: Secondary | ICD-10-CM

## 2021-08-22 DIAGNOSIS — E782 Mixed hyperlipidemia: Secondary | ICD-10-CM

## 2021-08-22 DIAGNOSIS — Z139 Encounter for screening, unspecified: Secondary | ICD-10-CM

## 2021-08-22 MED ORDER — TRETINOIN 0.025 % EX CREA
TOPICAL_CREAM | Freq: Every day | CUTANEOUS | 0 refills | Status: DC
  Filled 2021-08-22: qty 20, 10d supply, fill #0

## 2021-08-22 MED ORDER — ATORVASTATIN CALCIUM 10 MG PO TABS
10.0000 mg | ORAL_TABLET | Freq: Every day | ORAL | 3 refills | Status: DC
  Filled 2021-08-22: qty 30, 30d supply, fill #0

## 2021-08-22 NOTE — Assessment & Plan Note (Signed)
Likely senile lentigo of the forearms we will prescribe Retin-A and asked patient to ensure use of sunscreen ?

## 2021-08-22 NOTE — Assessment & Plan Note (Signed)
We will obtain patient a mammogram through the scholarship program ?

## 2021-08-22 NOTE — Assessment & Plan Note (Signed)
Patient had yet to start cholesterol medication I refilled the atorvastatin she does have high lipid levels from the last visit ?

## 2021-08-22 NOTE — Assessment & Plan Note (Signed)
This patient needs a tetanus vaccine she is uninsured and does not have a Orthoptist which is now required by the drug company in order for this patient to receive tetanus vaccine using the patient assistance program ? ?This is an objectionable health equity barrier we will see if we can overcome this ?

## 2021-08-22 NOTE — Assessment & Plan Note (Signed)
Will obtain health screenings including iron levels at this visit along with blood counts and metabolic panel ?

## 2021-08-22 NOTE — Patient Instructions (Signed)
Labs today :metabolic panel, iron levels, blood counts ? ?Resume atorvastatin daily ? ?Use Retin A cream on hands for aging spots and also use sunscreen in summer  30 strength ? ?Mammogram will be obtained ? ?A pap smear visit with one of our providers will be scheduled ? ?We will try to get approval for a tetanus shot and will contact you regarding this timing ? ?Return Dr Delford Field 4 months ?

## 2021-08-23 ENCOUNTER — Other Ambulatory Visit: Payer: Self-pay

## 2021-08-23 LAB — COMPREHENSIVE METABOLIC PANEL
ALT: 28 IU/L (ref 0–32)
AST: 22 IU/L (ref 0–40)
Albumin/Globulin Ratio: 1.6 (ref 1.2–2.2)
Albumin: 4.6 g/dL (ref 3.8–4.9)
Alkaline Phosphatase: 94 IU/L (ref 44–121)
BUN/Creatinine Ratio: 24 — ABNORMAL HIGH (ref 9–23)
BUN: 15 mg/dL (ref 6–24)
Bilirubin Total: <0.2 mg/dL (ref 0.0–1.2)
CO2: 24 mmol/L (ref 20–29)
Calcium: 9.5 mg/dL (ref 8.7–10.2)
Chloride: 104 mmol/L (ref 96–106)
Creatinine, Ser: 0.63 mg/dL (ref 0.57–1.00)
Globulin, Total: 2.8 g/dL (ref 1.5–4.5)
Glucose: 103 mg/dL — ABNORMAL HIGH (ref 70–99)
Potassium: 4.2 mmol/L (ref 3.5–5.2)
Sodium: 145 mmol/L — ABNORMAL HIGH (ref 134–144)
Total Protein: 7.4 g/dL (ref 6.0–8.5)
eGFR: 105 mL/min/{1.73_m2} (ref >59)

## 2021-08-23 LAB — CBC WITH DIFFERENTIAL/PLATELET
Basophils Absolute: 0 10*3/uL (ref 0.0–0.2)
Basos: 1 %
EOS (ABSOLUTE): 0.1 10*3/uL (ref 0.0–0.4)
Eos: 2 %
Hematocrit: 43.1 % (ref 34.0–46.6)
Hemoglobin: 14.3 g/dL (ref 11.1–15.9)
Immature Grans (Abs): 0 10*3/uL (ref 0.0–0.1)
Immature Granulocytes: 0 %
Lymphocytes Absolute: 2.3 10*3/uL (ref 0.7–3.1)
Lymphs: 37 %
MCH: 28.4 pg (ref 26.6–33.0)
MCHC: 33.2 g/dL (ref 31.5–35.7)
MCV: 86 fL (ref 79–97)
Monocytes Absolute: 0.4 10*3/uL (ref 0.1–0.9)
Monocytes: 6 %
Neutrophils Absolute: 3.3 10*3/uL (ref 1.4–7.0)
Neutrophils: 54 %
Platelets: 216 10*3/uL (ref 150–450)
RBC: 5.03 x10E6/uL (ref 3.77–5.28)
RDW: 13.4 % (ref 11.7–15.4)
WBC: 6.2 10*3/uL (ref 3.4–10.8)

## 2021-08-23 LAB — IRON,TIBC AND FERRITIN PANEL
Ferritin: 155 ng/mL — ABNORMAL HIGH (ref 15–150)
Iron Saturation: 18 % (ref 15–55)
Iron: 50 ug/dL (ref 27–159)
Total Iron Binding Capacity: 272 ug/dL (ref 250–450)
UIBC: 222 ug/dL (ref 131–425)

## 2021-08-24 ENCOUNTER — Telehealth: Payer: Self-pay

## 2021-08-24 ENCOUNTER — Other Ambulatory Visit: Payer: Self-pay

## 2021-08-24 NOTE — Telephone Encounter (Signed)
Pt was called and is aware of results, DOB was confirmed.  ? ?Interpreter [phone] ?

## 2021-08-24 NOTE — Telephone Encounter (Signed)
-----   Message from Storm Frisk, MD sent at 08/23/2021  8:10 AM EST ----- ?Let pt know liver kidney blood counts normal  ?Iron levels are NORMAL ?

## 2021-08-25 ENCOUNTER — Other Ambulatory Visit: Payer: Self-pay

## 2021-09-11 ENCOUNTER — Other Ambulatory Visit: Payer: Self-pay | Admitting: Obstetrics and Gynecology

## 2021-09-11 DIAGNOSIS — Z1231 Encounter for screening mammogram for malignant neoplasm of breast: Secondary | ICD-10-CM

## 2021-11-09 ENCOUNTER — Ambulatory Visit: Payer: Self-pay | Admitting: *Deleted

## 2021-11-09 ENCOUNTER — Ambulatory Visit
Admission: RE | Admit: 2021-11-09 | Discharge: 2021-11-09 | Disposition: A | Discharge status: Home / Self Care | Payer: No Typology Code available for payment source | Source: Ambulatory Visit | Attending: Obstetrics and Gynecology | Admitting: Obstetrics and Gynecology

## 2021-11-09 VITALS — BP 110/66 | Wt 180.4 lb

## 2021-11-09 DIAGNOSIS — Z01419 Encounter for gynecological examination (general) (routine) without abnormal findings: Secondary | ICD-10-CM

## 2021-11-09 DIAGNOSIS — Z1231 Encounter for screening mammogram for malignant neoplasm of breast: Secondary | ICD-10-CM

## 2021-11-09 NOTE — Patient Instructions (Signed)
Explained breast self awareness with Kim Cain Drucie Opitz. Pap smear completed today. Let her know B[facility]CP will cover Pap smears and HPV typing every 5 years unless has a history of abnormal Pap smears. Referred patient to the [facility] for a screening mammogram on mobile unit. Appointment scheduled Thursday, Nov 09, 2021 at 0930. Patient aware of appointment and will be there. Let patient know will follow up with her within the next couple weeks with results of Pap smear by phone. Informed patient that the [facility] will follow up with her within the next couple of weeks with results of her mammogram by letter or phone. [facility] Kim Cain verbalized understanding.  Joe Gee, Kathaleen Maser, RN 9:25 AM

## 2021-11-09 NOTE — Progress Notes (Signed)
Kim Cain is a 56 y.o. [id number] female who presents to [facility] clinic today with no complaints.    Pap Smear: Pap smear completed today. Last Pap smear was 10/23/2011 at [facility] and was normal. Per patient has no history of an abnormal Pap smear. Last Pap smear result is available in Epic.   Physical exam: Breasts Breasts symmetrical. No skin abnormalities bilateral breasts. Left nipple inverted per patient is normal for her. No nipple discharge bilateral breasts. No lymphadenopathy. No lumps palpated bilateral breasts. No complaints of pain or tenderness on exam.     Pelvic/Bimanual Ext Genitalia No lesions, no swelling and no discharge observed on external genitalia.        Vagina Vagina pink and normal texture. No lesions or discharge observed in vagina.        Cervix Cervix is present. Cervix pink and of normal texture. No discharge observed.    Uterus Uterus is present and palpable. Uterus in normal position and normal size.        Adnexae Bilateral ovaries present and palpable. No tenderness on palpation.         Rectovaginal No rectal exam completed today since patient had no rectal complaints. No skin abnormalities observed on exam.     Smoking History: Patient has never smoked.   Patient Navigation: Patient education provided. Access to services provided for patient through [facility] program. Spanish interpreter Natale Lay from [facility] provided.   Colorectal Cancer Screening: Per patient has never had colonoscopy completed. Patient completed a FIT Test 04/24/2021 that was negative. No complaints today.    Breast and Cervical Cancer Risk Assessment: Patient has family history of a paternal aunt having breast cancer. Patient has no known genetic mutations or history of radiation treatment to the chest before age 2. Patient does not have history of cervical dysplasia, immunocompromised, or DES exposure in-utero.  Risk Assessment     Risk  Scores       11/09/2021   Last edited by: Narda Rutherford, LPN   5-year risk: 0.6 %   Lifetime risk: 4.3 %            A: [facility]CP exam with pap smear No complaints.  P: Referred patient to the [facility] for a screening mammogram on mobile unit. Appointment scheduled Thursday, Nov 09, 2021 at 0930.  Priscille Heidelberg, RN 11/09/2021 9:25 AM

## 2021-11-10 LAB — CYTOLOGY - PAP
Adequacy: ABSENT
Comment: NEGATIVE
Diagnosis: NEGATIVE
High risk HPV: NEGATIVE

## 2021-11-14 ENCOUNTER — Telehealth: Payer: Self-pay

## 2021-11-14 NOTE — Telephone Encounter (Signed)
Via Natale Lay, Spanish Interpreter Haroldine Laws), Patient informed negative Pap/HPV results. Repeat pap in 5 years. Patient verbalized understanding.

## 2022-12-11 ENCOUNTER — Ambulatory Visit: Payer: Self-pay

## 2022-12-12 ENCOUNTER — Ambulatory Visit: Payer: Self-pay | Attending: Critical Care Medicine

## 2022-12-18 ENCOUNTER — Ambulatory Visit: Payer: Self-pay | Attending: Critical Care Medicine

## 2022-12-18 ENCOUNTER — Ambulatory Visit: Payer: Self-pay | Attending: Critical Care Medicine | Admitting: Critical Care Medicine

## 2022-12-18 ENCOUNTER — Other Ambulatory Visit: Payer: Self-pay

## 2022-12-18 ENCOUNTER — Encounter: Payer: Self-pay | Admitting: Critical Care Medicine

## 2022-12-18 VITALS — BP 136/82 | HR 67 | Wt 176.6 lb

## 2022-12-18 DIAGNOSIS — E782 Mixed hyperlipidemia: Secondary | ICD-10-CM

## 2022-12-18 DIAGNOSIS — M25561 Pain in right knee: Secondary | ICD-10-CM

## 2022-12-18 DIAGNOSIS — G8929 Other chronic pain: Secondary | ICD-10-CM

## 2022-12-18 DIAGNOSIS — L814 Other melanin hyperpigmentation: Secondary | ICD-10-CM

## 2022-12-18 DIAGNOSIS — Z1211 Encounter for screening for malignant neoplasm of colon: Secondary | ICD-10-CM

## 2022-12-18 MED ORDER — DICLOFENAC SODIUM 1 % EX GEL
2.0000 g | Freq: Four times a day (QID) | CUTANEOUS | 2 refills | Status: DC
  Filled 2022-12-18: qty 100, 13d supply, fill #0

## 2022-12-18 MED ORDER — ATORVASTATIN CALCIUM 10 MG PO TABS
10.0000 mg | ORAL_TABLET | Freq: Every day | ORAL | 3 refills | Status: DC
  Filled 2022-12-18: qty 90, 90d supply, fill #0

## 2022-12-18 NOTE — Progress Notes (Signed)
Established Patient Office Visit  Subjective:  Patient ID: Kim Cain Kim Cain, female    DOB: 1966-05-30  Age: 57 y.o. MRN: [id number]  CC: Primary care follow-up  HPI 08/2021 [facility] [name] presents for primary care follow-up.   This patient was previously seen in November 2022 to establish.  Since that time the patient's urinary frequency has resolved.  She does complain of some dark spots on the dorsum of both hands and forearms.  She also would like to have her iron levels checked.  Note today the patient's Spanish interpretation was provided by her daughter aracelli The patient also needs a mammogram and Pap smear.  She is already had colon cancer screening it was negative. Patient also needs a tetanus shot but she does not have insurance and she cannot qualify for the drug companies vaccine program because she does not have a [facility] surgeon number we will need to determine how to overcome this barrier as she would like to receive the tetanus vaccine.  12/18/22  Patient seen in return follow-up visit was assisted by Spanish video interpreter Albin Felling [phone]  Patient is complained of right knee pain for the past 2 months worse with extension and also poor with flexion.  Note patient's been off atorvastatin needs refill.  Needs colon cancer screening as well.  Patient supplied for the ER encouraged need to know status of this.  Patient needs lipid follow-ups.  She is not taking the atorvastatin.  Patient states the Retin-A did not improve the lesions on the skin  Past Medical History:  Diagnosis Date   Distal radius fracture, left     Past Surgical History:  Procedure Laterality Date   ORIF WRIST FRACTURE Left 01/10/2016   Procedure: OPEN REDUCTION INTERNAL FIXATION (ORIF) LEFT WRIST FRACTURE;  Surgeon: Sheral Apley, MD;  Location: [facility];  Service: Orthopedics;  Laterality: Left;   TUBAL LIGATION      Family History   Problem Relation Age of Onset   Alcohol abuse Mother    Colon cancer Father    Diabetes Sister    Hypertension Sister    Heart disease Sister    Leukemia Maternal Aunt    Breast cancer Paternal Aunt    Leukemia Paternal Aunt     Social History   Socioeconomic History   Marital status: Married    Spouse name: Not on file   Number of children: 7   Years of education: Not on file   Highest education level: Never attended school  Occupational History   Not on file  Tobacco Use   Smoking status: Former   Smokeless tobacco: Never  Building services engineer Use: Never used  Substance and Sexual Activity   Alcohol use: Yes    Comment: social   Drug use: No   Sexual activity: Not Currently    Birth control/protection: Surgical  Other Topics Concern   Not on file  Social History Narrative   Not on file   Social Determinants of Health   Financial Resource Strain: Not on file  Food Insecurity: No Food Insecurity (11/09/2021)   Hunger Vital Sign    Worried About Running Out of Food in the Last Year: Never true    Ran Out of Food in the Last Year: Never true  Transportation Needs: Unmet Transportation Needs (11/09/2021)   PRAPARE - Administrator, Civil Service (Medical): Yes    Lack of Transportation (Non-Medical): Yes  Physical Activity:  Not on file  Stress: Not on file  Social Connections: Not on file  Intimate Partner Violence: Not on file    Outpatient Medications Prior to Visit  Medication Sig Dispense Refill   Multiple Vitamin (MULTIVITAMIN WITH MINERALS) TABS tablet Take 1 tablet by mouth daily. (Patient not taking: Reported on 08/22/2021)     atorvastatin (LIPITOR) 10 MG tablet Take 1 tablet (10 mg total) by mouth daily. (Patient not taking: Reported on 12/18/2022) 90 tablet 3   tretinoin (RETIN-A) 0.025 % cream Apply topically at bedtime to the back of the hands (Patient not taking: Reported on 12/18/2022) 20 g 0   No facility-administered medications prior to  visit.    No Known Allergies  ROS Review of Systems  Constitutional:  Negative for chills, diaphoresis and fever.  HENT:  Negative for congestion, hearing loss, nosebleeds, sore throat and tinnitus.   Eyes:  Negative for photophobia and redness.  Respiratory:  Negative for cough, shortness of breath, wheezing and stridor.   Cardiovascular:  Negative for chest pain, palpitations and leg swelling.  Gastrointestinal:  Negative for abdominal pain, blood in stool, constipation, diarrhea, nausea and vomiting.  Endocrine: Negative for polydipsia.  Genitourinary:  Negative for dysuria, flank pain, frequency, hematuria and urgency.  Musculoskeletal:  Negative for back pain, myalgias and neck pain.       Right chronic knee pain  Skin:  Negative for rash.       Skin spots  Allergic/Immunologic: Negative for environmental allergies.  Neurological:  Negative for dizziness, tremors, seizures, weakness and headaches.  Hematological:  Does not bruise/bleed easily.  Psychiatric/Behavioral:  Negative for suicidal ideas. The patient is not nervous/anxious.       Objective:    Physical Exam Vitals reviewed.  Constitutional:      Appearance: Normal appearance. She is well-developed. She is not diaphoretic.  HENT:     Head: Normocephalic and atraumatic.     Nose: No nasal deformity, septal deviation, mucosal edema or rhinorrhea.     Right Sinus: No maxillary sinus tenderness or frontal sinus tenderness.     Left Sinus: No maxillary sinus tenderness or frontal sinus tenderness.     Mouth/Throat:     Pharynx: No oropharyngeal exudate.  Eyes:     General: No scleral icterus.    Conjunctiva/sclera: Conjunctivae normal.     Pupils: Pupils are equal, round, and reactive to light.  Neck:     Thyroid: No thyromegaly.     Vascular: No carotid bruit or JVD.     Trachea: Trachea normal. No tracheal tenderness or tracheal deviation.  Cardiovascular:     Rate and Rhythm: Normal rate and regular rhythm.      Chest Wall: PMI is not displaced.     Pulses: Normal pulses. No decreased pulses.     Heart sounds: Normal heart sounds, S1 normal and S2 normal. Heart sounds not distant. No murmur heard.    No systolic murmur is present.     No diastolic murmur is present.     No friction rub. No gallop. No S3 or S4 sounds.  Pulmonary:     Effort: No tachypnea, accessory muscle usage or respiratory distress.     Breath sounds: No stridor. No decreased breath sounds, wheezing, rhonchi or rales.  Chest:     Chest wall: No tenderness.  Abdominal:     General: Bowel sounds are normal. There is no distension.     Palpations: Abdomen is soft. Abdomen is not rigid.  Tenderness: There is no abdominal tenderness. There is no guarding or rebound.  Musculoskeletal:        General: Tenderness present. Normal range of motion.     Cervical back: Normal range of motion and neck supple. No edema, erythema or rigidity. No muscular tenderness. Normal range of motion.     Comments: Decreased range of motion right knee no evidence of effusion  Lymphadenopathy:     Head:     Right side of head: No submental or submandibular adenopathy.     Left side of head: No submental or submandibular adenopathy.     Cervical: No cervical adenopathy.  Skin:    General: Skin is warm and dry.     Coloration: Skin is not pale.     Findings: No lesion or rash.     Nails: There is no clubbing.     Comments: Brown senile lentigos seen over both dorsums of the hand and forearms  Neurological:     Mental Status: She is alert and oriented to person, place, and time.     Sensory: No sensory deficit.  Psychiatric:        Speech: Speech normal.        Behavior: Behavior normal.     BP 136/82 (BP Location: Right Arm, Patient Position: Sitting, Cuff Size: Large)   Pulse 67   Wt 176 lb 9.6 oz (80.1 kg)   LMP 01/04/2016   SpO2 98%   BMI 34.49 kg/m  Wt Readings from Last 3 Encounters:  12/18/22 176 lb 9.6 oz (80.1 kg)   11/09/21 180 lb 6.4 oz (81.8 kg)  08/22/21 181 lb (82.1 kg)     Health Maintenance Due  Topic Date Due   COVID-19 Vaccine (1) Never done   DTaP/Tdap/Td (1 - Tdap) Never done   Zoster Vaccines- Shingrix (1 of 2) Never done   COLON CANCER SCREENING ANNUAL FOBT  04/24/2022    There are no preventive care reminders to display for this patient.  Lab Results  Component Value Date   TSH 1.530 03/01/2021   Lab Results  Component Value Date   WBC 6.2 08/22/2021   HGB 14.3 08/22/2021   HCT 43.1 08/22/2021   MCV 86 08/22/2021   PLT 216 08/22/2021   Lab Results  Component Value Date   NA 145 (H) 08/22/2021   K 4.2 08/22/2021   CO2 24 08/22/2021   GLUCOSE 103 (H) 08/22/2021   BUN 15 08/22/2021   CREATININE 0.63 08/22/2021   BILITOT <0.2 08/22/2021   ALKPHOS 94 08/22/2021   AST 22 08/22/2021   ALT 28 08/22/2021   PROT 7.4 08/22/2021   ALBUMIN 4.6 08/22/2021   CALCIUM 9.5 08/22/2021   EGFR 105 08/22/2021   Lab Results  Component Value Date   CHOL 256 (H) 03/01/2021   Lab Results  Component Value Date   HDL 50 03/01/2021   Lab Results  Component Value Date   LDLCALC 190 (H) 03/01/2021   Lab Results  Component Value Date   TRIG 92 03/01/2021   Lab Results  Component Value Date   CHOLHDL 5.1 (H) 03/01/2021   No results found for: "HGBA1C"    Assessment & Plan:   Problem List Items Addressed This Visit       Musculoskeletal and Integument   Senile lentigo    Monitor for now        Other   Mixed hyperlipidemia - Primary    Reassess lipids renew atorvastatin  Relevant Medications   atorvastatin (LIPITOR) 10 MG tablet   Other Relevant Orders   Lipid panel   Chronic pain of right knee    Give knee exercises topical Voltaren gel refer to orthopedics      Relevant Orders   Ambulatory referral to Orthopedic Surgery   Colon cancer screening    Give fecal occult kit for colon cancer screening      Relevant Orders   Fecal occult blood,  imunochemical   Meds ordered this encounter  Medications   atorvastatin (LIPITOR) 10 MG tablet    Sig: Take 1 tablet (10 mg total) by mouth daily.    Dispense:  90 tablet    Refill:  3   diclofenac Sodium (VOLTAREN) 1 % GEL    Sig: Apply 2 grams topically 4 (four) times daily. To right knee    Dispense:  100 g    Refill:  2    Follow-up: Return in about 1 year (around 12/18/2023) for primary care follow up.    Shan Levans, MD

## 2022-12-18 NOTE — Assessment & Plan Note (Signed)
Give fecal occult kit for colon cancer screening

## 2022-12-18 NOTE — Assessment & Plan Note (Signed)
Reassess lipids renew atorvastatin

## 2022-12-18 NOTE — Assessment & Plan Note (Signed)
Give knee exercises topical Voltaren gel refer to orthopedics

## 2022-12-18 NOTE — Patient Instructions (Addendum)
Follow knee exercises Take gel to right knee 4 times a day Resume atorvastatin one daily for cholesterol Labs :colon cancer screen and cholesterol panel Return Dr Delford Field 1 year  Sigue los ejercicios de rodilla. Tomar gel en rodilla derecha 4 veces al da. Reanudar atorvastatina una vez al da para el colesterol. Laboratorios: examen de cncer de colon y panel de colesterol. Volver Dr Delford Field 1 ao Open in [employer]

## 2022-12-18 NOTE — Assessment & Plan Note (Signed)
Monitor for now

## 2022-12-19 ENCOUNTER — Telehealth: Payer: Self-pay

## 2022-12-19 LAB — LIPID PANEL
Chol/HDL Ratio: 4.7 ratio — ABNORMAL HIGH (ref 0.0–4.4)
Cholesterol, Total: 266 mg/dL — ABNORMAL HIGH (ref 100–199)
HDL: 57 mg/dL (ref >39)
LDL Chol Calc (NIH): 193 mg/dL — ABNORMAL HIGH (ref 0–99)
Triglycerides: 91 mg/dL (ref 0–149)
VLDL Cholesterol Cal: 16 mg/dL (ref 5–40)

## 2022-12-19 NOTE — Telephone Encounter (Signed)
Pt was called and is aware of results, DOB was confirmed    Interpreter: carla Number: [id number] 

## 2022-12-19 NOTE — Progress Notes (Signed)
Let pt know cholesterol very high, start atorvastatin as prescribed

## 2022-12-19 NOTE — Telephone Encounter (Signed)
-----   Message from Storm Frisk, MD sent at 12/19/2022  5:47 AM EDT ----- Let pt know cholesterol very high, start atorvastatin as prescribed

## 2022-12-27 LAB — FECAL OCCULT BLOOD, IMMUNOCHEMICAL: Fecal Occult Bld: NEGATIVE

## 2022-12-27 NOTE — Progress Notes (Signed)
Let pt know colon cancer screen negative recheck one yr

## 2022-12-28 ENCOUNTER — Telehealth: Payer: Self-pay

## 2022-12-28 NOTE — Telephone Encounter (Signed)
-----   Message from Shan Levans sent at 12/27/2022  2:42 PM EDT ----- Let pt know colon cancer screen negative recheck one yr

## 2022-12-28 NOTE — Telephone Encounter (Signed)
Pt was called and is aware of results, DOB was confirmed.  Interpreter d # [phone]

## 2023-01-01 ENCOUNTER — Other Ambulatory Visit (INDEPENDENT_AMBULATORY_CARE_PROVIDER_SITE_OTHER): Payer: Self-pay

## 2023-01-01 ENCOUNTER — Ambulatory Visit (INDEPENDENT_AMBULATORY_CARE_PROVIDER_SITE_OTHER): Payer: Self-pay | Admitting: Orthopaedic Surgery

## 2023-01-01 ENCOUNTER — Encounter: Payer: Self-pay | Admitting: Orthopaedic Surgery

## 2023-01-01 DIAGNOSIS — M25561 Pain in right knee: Secondary | ICD-10-CM

## 2023-01-01 DIAGNOSIS — G8929 Other chronic pain: Secondary | ICD-10-CM

## 2023-01-01 NOTE — Progress Notes (Signed)
Office Visit Note   Patient: Kim Cain           Date of Birth: 1966/01/22           MRN: [id number] Visit Date: 01/01/2023              Requested by: Storm Frisk, MD [address] [place],  [place] [address] PCP: Storm Frisk, MD   Assessment & Plan: Visit Diagnoses:  1. 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  We will provide her with home exercises for strengthening.  Also discussed the importance of weight loss.  Also recommended Voltaren gel.  Follow-Up Instructions: No follow-ups on file.   Orders:  Orders Placed This Encounter  Procedures   XR KNEE 3 VIEW RIGHT   No orders of the defined types were placed in this encounter.     Procedures: No procedures performed   Clinical Data: No additional findings.   Subjective: Chief Complaint  Patient presents with   Right Knee - Pain    HPI Patient is a 57 year old Hispanic female here with anterior right knee pain for 2 months.  Pain comes and goes and feels like it is stabbing at times.  Activity makes it worse.  Stairs make it worse.  She cleans houses for living.  She feels some clicking at times. Review of Systems  Constitutional: Negative.   HENT: Negative.    Eyes: Negative.   Respiratory: Negative.    Cardiovascular: Negative.   Endocrine: Negative.   Musculoskeletal: Negative.   Neurological: Negative.   Hematological: Negative.   Psychiatric/Behavioral: Negative.    All other systems reviewed and are negative.    Objective: Vital Signs: LMP 01/04/2016   Physical Exam Vitals and nursing note reviewed.  Constitutional:      Appearance: She is well-developed.  HENT:     Head: Atraumatic.     Nose: Nose normal.  Eyes:     Extraocular Movements: Extraocular movements intact.  Cardiovascular:     Pulses: Normal pulses.  Pulmonary:      Effort: Pulmonary effort is normal.  Abdominal:     Palpations: Abdomen is soft.  Musculoskeletal:     Cervical back: Neck supple.  Skin:    General: Skin is warm.     Capillary Refill: Capillary refill takes less than 2 seconds.  Neurological:     Mental Status: She is alert. Mental status is at baseline.  Psychiatric:        Behavior: Behavior normal.        Thought Content: Thought content normal.        Judgment: Judgment normal.     Ortho Exam Examination right knee shows no joint effusion.  Slight tenderness around the medial and lateral retinaculum.  Collaterals and cruciates are stable.  No joint line tenderness. Specialty Comments:  No specialty comments available.  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    PMFS History: Patient Active Problem List   Diagnosis Date Noted   Chronic pain of right knee 12/18/2022   Colon cancer screening 12/18/2022   Senile lentigo 08/22/2021   Encounter for health-related screening 08/22/2021   Encounter for screening mammogram for malignant neoplasm of breast 08/22/2021   Need for tetanus, diphtheria, and acellular pertussis (Tdap) vaccine 08/22/2021   Illiterate 04/20/2021  Gingivitis 04/20/2021   Mixed hyperlipidemia 04/20/2021   Varicose veins of bilateral lower extremities with other complications 02/28/2021   Past Medical History:  Diagnosis Date   Distal radius fracture, left     Family History  Problem Relation Age of Onset   Alcohol abuse Mother    Colon cancer Father    Diabetes Sister    Hypertension Sister    Heart disease Sister    Leukemia Maternal Aunt    Breast cancer Paternal Aunt    Leukemia Paternal Aunt     Past Surgical History:  Procedure Laterality Date   ORIF WRIST FRACTURE Left 01/10/2016   Procedure: OPEN REDUCTION INTERNAL FIXATION (ORIF) LEFT WRIST FRACTURE;  Surgeon: Sheral Apley, MD;  Location: [facility];  Service: Orthopedics;  Laterality: Left;   TUBAL LIGATION     Social History   Occupational History   Not on file  Tobacco Use   Smoking status: Former   Smokeless tobacco: Never  Vaping Use   Vaping status: Never Used  Substance and Sexual Activity   Alcohol use: Yes    Comment: social   Drug use: No   Sexual activity: Not Currently    Birth control/protection: Surgical

## 2023-02-12 ENCOUNTER — Ambulatory Visit: Payer: Self-pay | Attending: Critical Care Medicine | Admitting: Critical Care Medicine

## 2023-02-12 ENCOUNTER — Other Ambulatory Visit: Payer: Self-pay

## 2023-02-12 ENCOUNTER — Encounter: Payer: Self-pay | Admitting: Critical Care Medicine

## 2023-02-12 VITALS — BP 116/77 | HR 72 | Wt 179.6 lb

## 2023-02-12 DIAGNOSIS — B351 Tinea unguium: Secondary | ICD-10-CM

## 2023-02-12 DIAGNOSIS — E782 Mixed hyperlipidemia: Secondary | ICD-10-CM

## 2023-02-12 DIAGNOSIS — Z1211 Encounter for screening for malignant neoplasm of colon: Secondary | ICD-10-CM

## 2023-02-12 DIAGNOSIS — L84 Corns and callosities: Secondary | ICD-10-CM | POA: Insufficient documentation

## 2023-02-12 DIAGNOSIS — Z23 Encounter for immunization: Secondary | ICD-10-CM

## 2023-02-12 DIAGNOSIS — I83893 Varicose veins of bilateral lower extremities with other complications: Secondary | ICD-10-CM

## 2023-02-12 DIAGNOSIS — M25561 Pain in right knee: Secondary | ICD-10-CM

## 2023-02-12 DIAGNOSIS — I872 Venous insufficiency (chronic) (peripheral): Secondary | ICD-10-CM

## 2023-02-12 DIAGNOSIS — G8929 Other chronic pain: Secondary | ICD-10-CM

## 2023-02-12 MED ORDER — CICLOPIROX 8 % EX SOLN
Freq: Every day | CUTANEOUS | 0 refills | Status: DC
  Filled 2023-02-12: qty 6.6, 30d supply, fill #0

## 2023-02-12 NOTE — Assessment & Plan Note (Signed)
Referral back to vascular surgery

## 2023-02-12 NOTE — Assessment & Plan Note (Signed)
Check lipids; continue statin 

## 2023-02-12 NOTE — Patient Instructions (Addendum)
Labs today Start penlac to toes nightly Follow knee exercises Flu vaccine given Stay on cholesterol  Referral to vascular doctor will be made Return 4 months  Laboratorios hoy Comience con [employer] dedos de los pies todas las noches Sigue los ejercicios de rodilla. Se administra la vacuna contra la gripe Mantngase en el colesterol  Se har derivacin a un mdico vascular. Volver 4 meses.

## 2023-02-12 NOTE — Assessment & Plan Note (Signed)
Consistent with chondromalacia of patella  Given the exercises in Spanish  Continue Voltaren gel topical

## 2023-02-12 NOTE — Assessment & Plan Note (Signed)
Reissued fit test

## 2023-02-12 NOTE — Assessment & Plan Note (Signed)
Referral to podiatry  

## 2023-02-12 NOTE — Assessment & Plan Note (Signed)
Topical Penlac

## 2023-02-12 NOTE — Progress Notes (Signed)
Established Patient Office Visit  Subjective:  Patient ID: Kim Cain, female    DOB: 04-21-1966  Age: 57 y.o. MRN: [id number]  CC: Primary care follow-up  HPI 08/2021 [facility] Haydn Coogan presents for primary care follow-up.   This patient was previously seen in November 2022 to establish.  Since that time the patient's urinary frequency has resolved.  She does complain of some dark spots on the dorsum of both hands and forearms.  She also would like to have her iron levels checked.  Note today the patient's Spanish interpretation was provided by her daughter Kim Cain The patient also needs a mammogram and Pap smear.  She is already had colon cancer screening it was negative. Patient also needs a tetanus shot but she does not have insurance and she cannot qualify for the drug companies vaccine program because she does not have a [facility] surgeon number we will need to determine how to overcome this barrier as she would like to receive the tetanus vaccine.  12/18/22  Patient seen in return follow-up visit was assisted by Spanish video interpreter Albin Felling [phone]  Patient is complained of right knee pain for the past 2 months worse with extension and also poor with flexion.  Note patient's been off atorvastatin needs refill.  Needs colon cancer screening as well.  Patient supplied for the ER encouraged need to know status of this.  Patient needs lipid follow-ups.  She is not taking the atorvastatin.  Patient states the Retin-A did not improve the lesions on the skin  02/12/23 This patient seen in return follow-up the visit was assisted by Spanish video interpreter Sue Lush [phone].  Patient does complain of thickening of the toes.  She was seen by orthopedics who offered her Voltaren gel and knee exercises.  The exercises were given in English she would need a Spanish version.  The patient does need a fecal occult test for colon cancer screening.  On arrival blood  pressure is excellent.  She is on the atorvastatin and needs lipids rechecked The patient complains of varicose veins Past Medical History:  Diagnosis Date   Distal radius fracture, left     Past Surgical History:  Procedure Laterality Date   ORIF WRIST FRACTURE Left 01/10/2016   Procedure: OPEN REDUCTION INTERNAL FIXATION (ORIF) LEFT WRIST FRACTURE;  Surgeon: Sheral Apley, MD;  Location: [facility];  Service: Orthopedics;  Laterality: Left;   TUBAL LIGATION      Family History  Problem Relation Age of Onset   Alcohol abuse Mother    Colon cancer Father    Diabetes Sister    Hypertension Sister    Heart disease Sister    Leukemia Maternal Aunt    Breast cancer Paternal Aunt    Leukemia Paternal Aunt     Social History   Socioeconomic History   Marital status: Widowed    Spouse name: Not on file   Number of children: 7   Years of education: Not on file   Highest education level: Never attended school  Occupational History   Not on file  Tobacco Use   Smoking status: Former   Smokeless tobacco: Never  Vaping Use   Vaping status: Never Used  Substance and Sexual Activity   Alcohol use: Yes    Comment: social   Drug use: No   Sexual activity: Not Currently    Birth control/protection: Surgical  Other Topics Concern   Not on file  Social History Narrative  Not on file   Social Determinants of Health   Financial Resource Strain: Not on file  Food Insecurity: No Food Insecurity (11/09/2021)   Hunger Vital Sign    Worried About Running Out of Food in the Last Year: Never true    Ran Out of Food in the Last Year: Never true  Transportation Needs: Unmet Transportation Needs (11/09/2021)   PRAPARE - Administrator, Civil Service (Medical): Yes    Lack of Transportation (Non-Medical): Yes  Physical Activity: Not on file  Stress: Not on file  Social Connections: Not on file  Intimate Partner Violence: Not on file    Outpatient  Medications Prior to Visit  Medication Sig Dispense Refill   atorvastatin (LIPITOR) 10 MG tablet Take 1 tablet (10 mg total) by mouth daily. 90 tablet 3   diclofenac Sodium (VOLTAREN) 1 % GEL Apply 2 grams topically 4 (four) times daily. To right knee (Patient not taking: Reported on 02/12/2023) 100 g 2   Multiple Vitamin (MULTIVITAMIN WITH MINERALS) TABS tablet Take 1 tablet by mouth daily. (Patient not taking: Reported on 02/12/2023)     No facility-administered medications prior to visit.    No Known Allergies  ROS Review of Systems  Constitutional:  Negative for chills, diaphoresis and fever.  HENT:  Negative for congestion, hearing loss, nosebleeds, sore throat and tinnitus.   Eyes:  Negative for photophobia and redness.  Respiratory:  Negative for cough, shortness of breath, wheezing and stridor.   Cardiovascular:  Negative for chest pain, palpitations and leg swelling.  Gastrointestinal:  Negative for abdominal pain, blood in stool, constipation, diarrhea, nausea and vomiting.  Endocrine: Negative for polydipsia.  Genitourinary:  Negative for dysuria, flank pain, frequency, hematuria and urgency.  Musculoskeletal:  Negative for back pain, myalgias and neck pain.       Right chronic knee pain  Skin:  Negative for rash.       Varicose veins  Allergic/Immunologic: Negative for environmental allergies.  Neurological:  Negative for dizziness, tremors, seizures, weakness and headaches.  Hematological:  Does not bruise/bleed easily.  Psychiatric/Behavioral:  Negative for suicidal ideas. The patient is not nervous/anxious.       Objective:    Physical Exam Vitals reviewed.  Constitutional:      Appearance: Normal appearance. She is well-developed. She is not diaphoretic.  HENT:     Head: Normocephalic and atraumatic.     Nose: No nasal deformity, septal deviation, mucosal edema or rhinorrhea.     Right Sinus: No maxillary sinus tenderness or frontal sinus tenderness.     Left  Sinus: No maxillary sinus tenderness or frontal sinus tenderness.     Mouth/Throat:     Pharynx: No oropharyngeal exudate.  Eyes:     General: No scleral icterus.    Conjunctiva/sclera: Conjunctivae normal.     Pupils: Pupils are equal, round, and reactive to light.  Neck:     Thyroid: No thyromegaly.     Vascular: No carotid bruit or JVD.     Trachea: Trachea normal. No tracheal tenderness or tracheal deviation.  Cardiovascular:     Rate and Rhythm: Normal rate and regular rhythm.     Chest Wall: PMI is not displaced.     Pulses: Normal pulses. No decreased pulses.     Heart sounds: Normal heart sounds, S1 normal and S2 normal. Heart sounds not distant. No murmur heard.    No systolic murmur is present.     No diastolic murmur is present.  No friction rub. No gallop. No S3 or S4 sounds.  Pulmonary:     Effort: No tachypnea, accessory muscle usage or respiratory distress.     Breath sounds: No stridor. No decreased breath sounds, wheezing, rhonchi or rales.  Chest:     Chest wall: No tenderness.  Abdominal:     General: Bowel sounds are normal. There is no distension.     Palpations: Abdomen is soft. Abdomen is not rigid.     Tenderness: There is no abdominal tenderness. There is no guarding or rebound.  Musculoskeletal:        General: Tenderness present. Normal range of motion.     Cervical back: Normal range of motion and neck supple. No edema, erythema or rigidity. No muscular tenderness. Normal range of motion.     Comments: Decreased range of motion right knee no evidence of effusion Varicose veins  Lymphadenopathy:     Head:     Right side of head: No submental or submandibular adenopathy.     Left side of head: No submental or submandibular adenopathy.     Cervical: No cervical adenopathy.  Skin:    General: Skin is warm and dry.     Coloration: Skin is not pale.     Findings: No lesion or rash.     Nails: There is no clubbing.     Comments: Brown senile lentigos  seen over both dorsums of the hand and forearms  Neurological:     Mental Status: She is alert and oriented to person, place, and time.     Sensory: No sensory deficit.  Psychiatric:        Speech: Speech normal.        Behavior: Behavior normal.     BP 116/77 (BP Location: Right Arm, Patient Position: Sitting, Cuff Size: Large)   Pulse 72   Wt 179 lb 9.6 oz (81.5 kg)   LMP 01/04/2016   SpO2 95%   BMI 35.08 kg/m  Wt Readings from Last 3 Encounters:  02/12/23 179 lb 9.6 oz (81.5 kg)  12/18/22 176 lb 9.6 oz (80.1 kg)  11/09/21 180 lb 6.4 oz (81.8 kg)     Health Maintenance Due  Topic Date Due   COVID-19 Vaccine (1) Never done   DTaP/Tdap/Td (1 - Tdap) Never done   Zoster Vaccines- Shingrix (1 of 2) Never done    There are no preventive care reminders to display for this patient.  Lab Results  Component Value Date   TSH 1.530 03/01/2021   Lab Results  Component Value Date   WBC 6.2 08/22/2021   HGB 14.3 08/22/2021   HCT 43.1 08/22/2021   MCV 86 08/22/2021   PLT 216 08/22/2021   Lab Results  Component Value Date   NA 145 (H) 08/22/2021   K 4.2 08/22/2021   CO2 24 08/22/2021   GLUCOSE 103 (H) 08/22/2021   BUN 15 08/22/2021   CREATININE 0.63 08/22/2021   BILITOT <0.2 08/22/2021   ALKPHOS 94 08/22/2021   AST 22 08/22/2021   ALT 28 08/22/2021   PROT 7.4 08/22/2021   ALBUMIN 4.6 08/22/2021   CALCIUM 9.5 08/22/2021   EGFR 105 08/22/2021   Lab Results  Component Value Date   CHOL 266 (H) 12/18/2022   Lab Results  Component Value Date   HDL 57 12/18/2022   Lab Results  Component Value Date   LDLCALC 193 (H) 12/18/2022   Lab Results  Component Value Date   TRIG 91 12/18/2022  Lab Results  Component Value Date   CHOLHDL 4.7 (H) 12/18/2022   No results found for: "HGBA1C"    Assessment & Plan:   Problem List Items Addressed This Visit       Cardiovascular and Mediastinum   Varicose veins of bilateral lower extremities with other  complications    Referral back to vascular surgery        Musculoskeletal and Integument   Onychomycosis    Topical Penlac      Relevant Medications   ciclopirox (PENLAC) 8 % solution   Other Relevant Orders   Ambulatory referral to Podiatry   Foot callus    Referral to podiatry      Relevant Orders   Ambulatory referral to Podiatry     Other   Mixed hyperlipidemia - Primary    Check lipids continue statin      Relevant Orders   Lipid panel   Chronic pain of right knee    Consistent with chondromalacia of patella  Given the exercises in Spanish  Continue Voltaren gel topical      Colon cancer screening    Reissued fit test      Other Visit Diagnoses     Venous insufficiency       Relevant Orders   Ambulatory referral to Vascular Surgery   Need for immunization against influenza       Relevant Orders   Flu vaccine trivalent PF, 6mos and older(Flulaval,Afluria,Fluarix,Fluzone) (Completed)       Meds ordered this encounter  Medications   ciclopirox (PENLAC) 8 % solution    Sig: Apply topically at bedtime. Apply over nail and surrounding skin. Apply daily over previous coat. After seven (7) days, may remove with alcohol and continue cycle.    Dispense:  6.6 mL    Refill:  0    Follow-up: Return in about 4 months (around 06/14/2023) for primary care follow up.    Shan Levans, MD

## 2023-02-13 LAB — LIPID PANEL
Chol/HDL Ratio: 3.5 ratio (ref 0.0–4.4)
Cholesterol, Total: 180 mg/dL (ref 100–199)
HDL: 52 mg/dL (ref >39)
LDL Chol Calc (NIH): 100 mg/dL — ABNORMAL HIGH (ref 0–99)
Triglycerides: 161 mg/dL — ABNORMAL HIGH (ref 0–149)
VLDL Cholesterol Cal: 28 mg/dL (ref 5–40)

## 2023-02-13 NOTE — Progress Notes (Signed)
Let pt know cholesterol is better stay on cholesterol pill follow plant based diet as much as possible

## 2023-02-14 ENCOUNTER — Telehealth: Payer: Self-pay

## 2023-02-14 NOTE — Telephone Encounter (Signed)
-----   Message from Shan Levans sent at 02/13/2023  6:17 AM EDT ----- Let pt know cholesterol is better stay on cholesterol pill follow plant based diet as much as possible

## 2023-02-14 NOTE — Telephone Encounter (Signed)
Pt was called and is aware of results, DOB was confirmed    Interpreter: Jose  Number: [phone]

## 2023-03-05 ENCOUNTER — Ambulatory Visit (INDEPENDENT_AMBULATORY_CARE_PROVIDER_SITE_OTHER): Payer: No Typology Code available for payment source | Admitting: Podiatry

## 2023-03-05 ENCOUNTER — Encounter: Payer: Self-pay | Admitting: Podiatry

## 2023-03-05 DIAGNOSIS — M2042 Other hammer toe(s) (acquired), left foot: Secondary | ICD-10-CM

## 2023-03-05 DIAGNOSIS — D492 Neoplasm of unspecified behavior of bone, soft tissue, and skin: Secondary | ICD-10-CM

## 2023-03-05 DIAGNOSIS — B351 Tinea unguium: Secondary | ICD-10-CM

## 2023-03-05 DIAGNOSIS — M2041 Other hammer toe(s) (acquired), right foot: Secondary | ICD-10-CM

## 2023-03-05 MED ORDER — CICLOPIROX 8 % EX SOLN
Freq: Every day | CUTANEOUS | 3 refills | Status: AC
Start: 2023-03-05 — End: 2023-06-03
  Filled 2023-03-06: qty 6.6, 30d supply, fill #0

## 2023-03-06 ENCOUNTER — Other Ambulatory Visit: Payer: Self-pay

## 2023-03-06 NOTE — Progress Notes (Signed)
Subjective:  Patient ID: Kim Cain, female    DOB: 28-Apr-1966,  MRN: [id number] HPI Chief Complaint  Patient presents with   Toe Pain    2nd toe right - toenail curved into the skin x years, tender x few months now, tries to keep trimmed out, concern for fungus as all the nails are yellow, PCP Rx'd ciclopirox-no help, calluses plantar forefoot, 5th toes and tip of 3rd toe right    New Patient (Initial Visit)    57 y.o. female presents with the above complaint.   ROS: Denies fever chills nausea vomiting  Past Medical History:  Diagnosis Date   Distal radius fracture, left    Past Surgical History:  Procedure Laterality Date   ORIF WRIST FRACTURE Left 01/10/2016   Procedure: OPEN REDUCTION INTERNAL FIXATION (ORIF) LEFT WRIST FRACTURE;  Surgeon: Sheral Apley, MD;  Location: [facility];  Service: Orthopedics;  Laterality: Left;   TUBAL LIGATION      Current Outpatient Medications:    atorvastatin (LIPITOR) 10 MG tablet, Take 1 tablet (10 mg total) by mouth daily., Disp: 90 tablet, Rfl: 3   ciclopirox (PENLAC) 8 % solution, Apply topically at bedtime. Apply over nail and surrounding skin. Apply daily over previous coat. After seven (7) days, may remove with alcohol and continue cycle., Disp: 6.6 mL, Rfl: 3   diclofenac Sodium (VOLTAREN) 1 % GEL, Apply 2 grams topically 4 (four) times daily. To right knee (Patient not taking: Reported on 02/12/2023), Disp: 100 g, Rfl: 2   Multiple Vitamin (MULTIVITAMIN WITH MINERALS) TABS tablet, Take 1 tablet by mouth daily. (Patient not taking: Reported on 02/12/2023), Disp: , Rfl:   No Known Allergies Review of Systems Objective:  There were no vitals filed for this visit.  General: Well developed, nourished, in no acute distress, alert and oriented x3   Dermatological: Skin is warm, dry and supple bilateral. Nails x 10 are thickened still appear to be dystrophic some mycotic; remaining integument appears  unremarkable at this time.  Other than a callus and fifth toes bilaterally the third toe bilateral there are no open sores, no preulcerative lesions, no rash or signs of infection present.  Vascular: Dorsalis Pedis artery and Posterior Tibial artery pedal pulses are 2/4 bilateral with immedate capillary fill time. Pedal hair growth present. No varicosities and no lower extremity edema present bilateral.   Neruologic: Grossly intact via light touch bilateral. Vibratory intact via tuning fork bilateral. Protective threshold with Semmes Wienstein monofilament intact to all pedal sites bilateral. Patellar and Achilles deep tendon reflexes 2+ bilateral. No Babinski or clonus noted bilateral.   Musculoskeletal: No gross boney pedal deformities bilateral.  Other than flexible hammertoe deformities.  No pain, crepitus, or limitation noted with foot and ankle range of motion bilateral. Muscular strength 5/5 in all groups tested bilateral.  Gait: Unassisted, Nonantalgic.    Radiographs:  None taken  Assessment & Plan:   Assessment: Hammertoe deformities nail dystrophy with onychomycosis and benign skin lesions.  Plan: Debrided benign skin lesions placed a buttress pad to help prevent skin lesions.  As well as toe caps.  Will continue with Penlac and the nails were trimmed.     Kim Cain T. [place], [place] dictated this note for Kim Cain

## 2023-03-07 ENCOUNTER — Other Ambulatory Visit: Payer: Self-pay | Admitting: *Deleted

## 2023-03-07 DIAGNOSIS — I872 Venous insufficiency (chronic) (peripheral): Secondary | ICD-10-CM

## 2023-03-15 ENCOUNTER — Ambulatory Visit (HOSPITAL_COMMUNITY)
Admission: RE | Admit: 2023-03-15 | Discharge: 2023-03-15 | Disposition: A | Discharge status: Home / Self Care | Payer: Self-pay | Source: Ambulatory Visit | Attending: Vascular Surgery

## 2023-03-15 ENCOUNTER — Ambulatory Visit (INDEPENDENT_AMBULATORY_CARE_PROVIDER_SITE_OTHER): Payer: No Typology Code available for payment source | Admitting: Physician Assistant

## 2023-03-15 VITALS — BP 109/76 | HR 67 | Temp 97.6°F | Wt 177.0 lb

## 2023-03-15 DIAGNOSIS — I872 Venous insufficiency (chronic) (peripheral): Secondary | ICD-10-CM | POA: Insufficient documentation

## 2023-03-15 DIAGNOSIS — M7989 Other specified soft tissue disorders: Secondary | ICD-10-CM

## 2023-03-15 DIAGNOSIS — I83893 Varicose veins of bilateral lower extremities with other complications: Secondary | ICD-10-CM

## 2023-03-15 NOTE — Progress Notes (Signed)
Office Note     CC:  follow up Requesting Provider:  Storm Frisk, MD  HPI: Kim Cain is a 57 y.o. (1965-06-23) female who presents for evaluation of painful varicosities of the left lower extremity.  Over the past 10 years she has noticed more prominence in her varicosities of her left lower leg.  She has also noticed more swelling and discoloration in the skin of her left leg.  Symptoms of aching, heaviness, and edema worsen over the course of the day.  She denies history of DVT, venous ulcerations, trauma, or prior vascular interventions.  She was provided compression in the past however she believes they were the wrong size.  She does not smoke.  She is accompanied by her son today who agreed to act as a Nurse, learning disability.   Past Medical History:  Diagnosis Date   Distal radius fracture, left     Past Surgical History:  Procedure Laterality Date   ORIF WRIST FRACTURE Left 01/10/2016   Procedure: OPEN REDUCTION INTERNAL FIXATION (ORIF) LEFT WRIST FRACTURE;  Surgeon: Sheral Apley, MD;  Location: [facility];  Service: Orthopedics;  Laterality: Left;   TUBAL LIGATION      Social History   Socioeconomic History   Marital status: Widowed    Spouse name: Not on file   Number of children: 7   Years of education: Not on file   Highest education level: Never attended school  Occupational History   Not on file  Tobacco Use   Smoking status: Former   Smokeless tobacco: Never  Vaping Use   Vaping status: Never Used  Substance and Sexual Activity   Alcohol use: Yes    Comment: social   Drug use: No   Sexual activity: Not Currently    Birth control/protection: Surgical  Other Topics Concern   Not on file  Social History Narrative   Not on file   Social Determinants of Health   Financial Resource Strain: Not on file  Food Insecurity: No Food Insecurity (11/09/2021)   Hunger Vital Sign    Worried About Running Out of Food in the Last  Year: Never true    Ran Out of Food in the Last Year: Never true  Transportation Needs: Unmet Transportation Needs (11/09/2021)   PRAPARE - Administrator, Civil Service (Medical): Yes    Lack of Transportation (Non-Medical): Yes  Physical Activity: Not on file  Stress: Not on file  Social Connections: Not on file  Intimate Partner Violence: Not on file    Family History  Problem Relation Age of Onset   Alcohol abuse Mother    Colon cancer Father    Diabetes Sister    Hypertension Sister    Heart disease Sister    Leukemia Maternal Aunt    Breast cancer Paternal Aunt    Leukemia Paternal Aunt     Current Outpatient Medications  Medication Sig Dispense Refill   atorvastatin (LIPITOR) 10 MG tablet Take 1 tablet (10 mg total) by mouth daily. 90 tablet 3   ciclopirox (PENLAC) 8 % solution Apply topically at bedtime. Apply over nail and surrounding skin. Apply daily over previous coat. After seven (7) days, may remove with alcohol and continue cycle. 6.6 mL 3   No current facility-administered medications for this visit.    No Known Allergies   REVIEW OF SYSTEMS:   [X]  denotes positive finding, [ ]  denotes negative finding Cardiac  Comments:  Chest pain or chest  pressure:    Shortness of breath upon exertion:    Short of breath when lying flat:    Irregular heart rhythm:        Vascular    Pain in calf, thigh, or hip brought on by ambulation:    Pain in feet at night that wakes you up from your sleep:     Blood clot in your veins:    Leg swelling:         Pulmonary    Oxygen at home:    Productive cough:     Wheezing:         Neurologic    Sudden weakness in arms or legs:     Sudden numbness in arms or legs:     Sudden onset of difficulty speaking or slurred speech:    Temporary loss of vision in one eye:     Problems with dizziness:         Gastrointestinal    Blood in stool:     Vomited blood:         Genitourinary    Burning when urinating:      Blood in urine:        Psychiatric    Major depression:         Hematologic    Bleeding problems:    Problems with blood clotting too easily:        Skin    Rashes or ulcers:        Constitutional    Fever or chills:      PHYSICAL EXAMINATION:  Vitals:   03/15/23 1029  BP: 109/76  Pulse: 67  Temp: 97.6 F (36.4 C)  TempSrc: Temporal  SpO2: 95%  Weight: 177 lb (80.3 kg)    General:  WDWN in NAD; vital signs documented above Gait: Not observed HENT: WNL, normocephalic Pulmonary: normal non-labored breathing , without Rales, rhonchi,  wheezing Cardiac: regular HR Abdomen: soft, NT, no masses Skin: without rashes Vascular Exam/Pulses: palpable DP pulses BLE Extremities: Varicose veins medial calf left leg with hyperpigmentation in the left medial ankle; veins are painful to touch along the medial aspect of her lower leg; no ulcerations noted Musculoskeletal: no muscle wasting or atrophy  Neurologic: A&O X 3 Psychiatric:  The pt has Normal affect.    Non-Invasive Vascular Imaging:    LEFT          Reflux NoRefluxReflux TimeDiameter cmsComments                          Yes                                   +--------------+---------+------+-----------+------------+--------+  CFV                    yes   >1 second                       +--------------+---------+------+-----------+------------+--------+  FV mid                  yes   >1 second                       +--------------+---------+------+-----------+------------+--------+  Popliteal              yes   >1 second                       +--------------+---------+------+-----------+------------+--------+  GSV at [facility]              yes    >500 ms      1.49              +--------------+---------+------+-----------+------------+--------+  GSV prox thigh          yes    >500 ms      1.01              +--------------+---------+------+-----------+------------+--------+   GSV mid thigh           yes    >500 ms      0.91              +--------------+---------+------+-----------+------------+--------+  GSV dist thigh          yes    >500 ms      1.25              +--------------+---------+------+-----------+------------+--------+  GSV at knee             yes    >500 ms      0.88              +--------------+---------+------+-----------+------------+--------+  GSV prox calf           yes    >500 ms      0.97              +--------------+---------+------+-----------+------------+--------+  GSV mid calf            yes    >500 ms      0.30              +--------------+---------+------+-----------+------------+--------+  GSV dist calf           yes    >500 ms      0.49              +--------------+---------+------+-----------+------------+--------+  SSV Pop Fossa no                            0.22                 ASSESSMENT/PLAN:: 57 y.o. female here for evaluation of left lower extremity edema and painful varicosities  Over the past 10 years the patient has developed worsening varicose veins.  She has never had a history of a DVT or venous ulcerations.  Left lower extremity venous reflux study was negative for DVT.  She has deep venous reflux in the common femoral vein, femoral vein and popliteal vein.  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  Recommendations included 20 to 30 mmHg thigh-high compression which she was fitted for and prescribed today.  We also discussed proper leg elevation which should be performed periodically throughout the day.  She will try to avoid prolonged sitting and standing.  She can also use NSAIDs for discomfort associated with her varicose veins.  She will follow-up after 3 months of the above recommendations to see one of our vein specialist to be evaluated for potential left greater saphenous vein ablation and possible stab  phlebectomy.   Emilie Rutter, PA-C [facility] [phone]  Clinic MD:   Karin Lieu

## 2023-03-26 ENCOUNTER — Ambulatory Visit: Payer: No Typology Code available for payment source | Admitting: Podiatry

## 2023-04-30 ENCOUNTER — Ambulatory Visit: Payer: No Typology Code available for payment source | Admitting: Podiatry

## 2023-05-02 ENCOUNTER — Ambulatory Visit: Payer: No Typology Code available for payment source | Admitting: Podiatry

## 2023-06-18 ENCOUNTER — Ambulatory Visit: Payer: Self-pay | Admitting: Nurse Practitioner

## 2023-07-03 ENCOUNTER — Ambulatory Visit (INDEPENDENT_AMBULATORY_CARE_PROVIDER_SITE_OTHER): Payer: Self-pay | Admitting: Vascular Surgery

## 2023-07-17 ENCOUNTER — Ambulatory Visit: Payer: Self-pay | Attending: Nurse Practitioner | Admitting: Nurse Practitioner

## 2023-07-17 ENCOUNTER — Encounter: Payer: Self-pay | Admitting: Nurse Practitioner

## 2023-07-17 ENCOUNTER — Other Ambulatory Visit: Payer: Self-pay

## 2023-07-17 VITALS — BP 107/70 | HR 84 | Resp 19 | Ht 60.0 in | Wt 177.0 lb

## 2023-07-17 DIAGNOSIS — B351 Tinea unguium: Secondary | ICD-10-CM

## 2023-07-17 DIAGNOSIS — E782 Mixed hyperlipidemia: Secondary | ICD-10-CM

## 2023-07-17 DIAGNOSIS — K219 Gastro-esophageal reflux disease without esophagitis: Secondary | ICD-10-CM

## 2023-07-17 MED ORDER — CICLOPIROX 8 % EX SOLN
Freq: Every day | CUTANEOUS | 0 refills | Status: AC
  Filled 2023-07-17: qty 6.6, 30d supply, fill #0

## 2023-07-17 MED ORDER — FAMOTIDINE 40 MG PO TABS
40.0000 mg | ORAL_TABLET | Freq: Every day | ORAL | 1 refills | Status: AC
Start: 2023-07-17 — End: ?
  Filled 2023-07-17: qty 30, 30d supply, fill #0

## 2023-07-17 MED ORDER — ATORVASTATIN CALCIUM 10 MG PO TABS
10.0000 mg | ORAL_TABLET | Freq: Every day | ORAL | 3 refills | Status: AC
  Filled 2023-07-17: qty 90, 90d supply, fill #0

## 2023-07-17 NOTE — Progress Notes (Signed)
I have seen and examined this patient with the advanced practice provider and agree with the clinical note.

## 2023-07-17 NOTE — Progress Notes (Signed)
Patient would like to come back lab work.

## 2023-07-17 NOTE — Progress Notes (Signed)
Kim Cain was seen today for medical management of chronic issues.  Diagnoses and all orders for this visit:  Onychomycosis -     ciclopirox (PENLAC) 8 % solution; Apply topically at bedtime. Apply over nail and surrounding skin. Apply daily over previous coat. After seven (7) days, may remove with alcohol and continue cycle. Keep feet clean and dry Continue to follow up with Podiatrist   GERD without esophagitis -     famotidine (PEPCID) 40 MG tablet; Take 1 tablet (40 mg total) by mouth daily. INSTRUCTIONS: Avoid GERD Triggers: acidic, spicy or fried foods, caffeine, coffee, sodas,  alcohol and chocolate.    Mixed hyperlipidemia -     atorvastatin (LIPITOR) 10 MG tablet; Take 1 tablet (10 mg total) by mouth daily. INSTRUCTIONS: Work on a low fat, heart healthy diet and participate in regular aerobic exercise program by working out at least 150 minutes per week; 5 days a week-30 minutes per day. Avoid red meat/beef/steak,  fried foods. junk foods, sodas, sugary drinks, unhealthy snacking, alcohol and smoking.  Drink at least 80 oz of water per day and monitor your carbohydrate intake daily.     Patient Active Problem List   Diagnosis Date Noted   Onychomycosis 02/12/2023   Foot callus 02/12/2023   Chronic pain of right knee 12/18/2022   Colon cancer screening 12/18/2022   Senile lentigo 08/22/2021   Encounter for health-related screening 08/22/2021   Encounter for screening mammogram for malignant neoplasm of breast 08/22/2021   Need for tetanus, diphtheria, and acellular pertussis (Tdap) vaccine 08/22/2021   Illiterate 04/20/2021   Gingivitis 04/20/2021   Mixed hyperlipidemia 04/20/2021   Varicose veins of bilateral lower extremities with other complications 02/28/2021     Patient has been counseled on age-appropriate routine health concerns for screening and prevention. These are reviewed and up-to-date. Referrals have been placed accordingly. Immunizations are up-to-date or  declined.    Subjective:   Chief Complaint  Patient presents with   Medical Management of Chronic Issues    [name] 58 y.o. female presents to office today with request refill on medication for toe fungus. States she has renewed her [employer] so she will schedule appt with Podiatrist. Patient reports burning sensation in throat accompanied by bad breath. States she had dental check-up and no dental cavities. Patient states her diet is rich in spicy foods, and carbonated drinks    Review of Systems  Constitutional: Negative.   HENT: Negative.    Eyes: Negative.   Respiratory: Negative.    Cardiovascular: Negative.   Gastrointestinal:  Positive for heartburn.       Bad breath Burning in throat  Genitourinary: Negative.   Musculoskeletal: Negative.   Skin:        Callous on right fourth toe  Neurological: Negative.   Psychiatric/Behavioral: Negative.      Past Medical History:  Diagnosis Date   Distal radius fracture, left     Past Surgical History:  Procedure Laterality Date   ORIF WRIST FRACTURE Left 01/10/2016   Procedure: OPEN REDUCTION INTERNAL FIXATION (ORIF) LEFT WRIST FRACTURE;  Surgeon: Sheral Apley, MD;  Location: [facility];  Service: Orthopedics;  Laterality: Left;   TUBAL LIGATION      Family History  Problem Relation Age of Onset   Alcohol abuse Mother    Colon cancer Father    Diabetes Sister    Hypertension Sister    Heart disease Sister    Leukemia Maternal Aunt  Breast cancer Paternal Aunt    Leukemia Paternal Aunt     Social History Reviewed with no changes to be made today.   Outpatient Medications Prior to Visit  Medication Sig Dispense Refill   atorvastatin (LIPITOR) 10 MG tablet Take 1 tablet (10 mg total) by mouth daily. (Patient not taking: Reported on 07/17/2023) 90 tablet 3   No facility-administered medications prior to visit.    No Known Allergies     Objective:    BP 107/70  (BP Location: Left Arm, Patient Position: Sitting, Cuff Size: Normal)   Pulse 84   Resp 19   Ht 5' (1.524 m)   Wt 177 lb (80.3 kg)   LMP 01/04/2016   SpO2 100%   BMI 34.57 kg/m  Wt Readings from Last 3 Encounters:  07/17/23 177 lb (80.3 kg)  03/15/23 177 lb (80.3 kg)  02/12/23 179 lb 9.6 oz (81.5 kg)    Physical Exam Constitutional:      Appearance: Normal appearance. She is obese.     Comments: 34.57 kg/m  HENT:     Head: Normocephalic.  Cardiovascular:     Rate and Rhythm: Normal rate and regular rhythm.     Pulses: Normal pulses.     Heart sounds: Normal heart sounds.  Abdominal:     General: Bowel sounds are normal.  Musculoskeletal:        General: Normal range of motion.     Cervical back: Normal range of motion.  Skin:    General: Skin is warm and dry.  Neurological:     Mental Status: She is alert and oriented to person, place, and time.  Psychiatric:        Mood and Affect: Mood normal.        Behavior: Behavior normal.        Thought Content: Thought content normal.        Judgment: Judgment normal.          Patient has been counseled extensively about nutrition and exercise as well as the importance of adherence with medications and regular follow-up. The patient was given clear instructions to go to ER or return to medical center if symptoms don't improve, worsen or new problems develop. The patient verbalized understanding.   Follow-up: Return if symptoms worsen or fail to improve.   Kim Rigg, FNP-BC [facility] and [facility] [place], [place] [phone]   07/17/2023, 4:46 PM

## 2023-10-15 IMAGING — MG MM DIGITAL SCREENING BILAT W/ TOMO AND CAD
8 series · 8 of 24 positions shown · non-contrast
Comparison: Previous exam(s).

CLINICAL DATA: Screening.

EXAM:
DIGITAL SCREENING BILATERAL MAMMOGRAM WITH TOMOSYNTHESIS AND CAD
TECHNIQUE: Bilateral screening digital craniocaudal and mediolateral oblique
mammograms were obtained. Bilateral screening digital breast
tomosynthesis was performed. The images were evaluated with
computer-aided detection.

[L CC synth-2D]
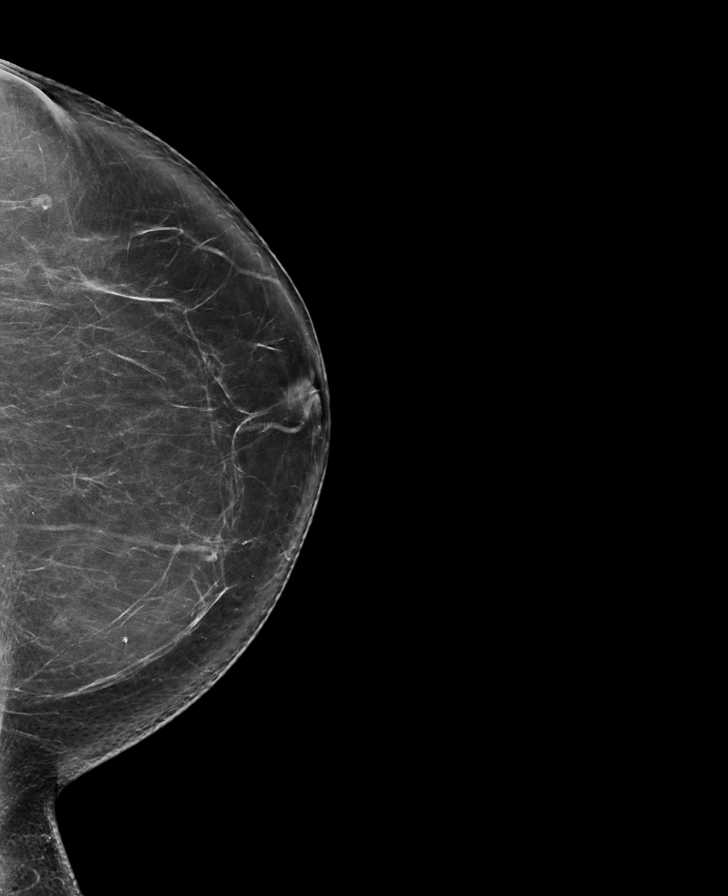

[R MLO synth-2D]
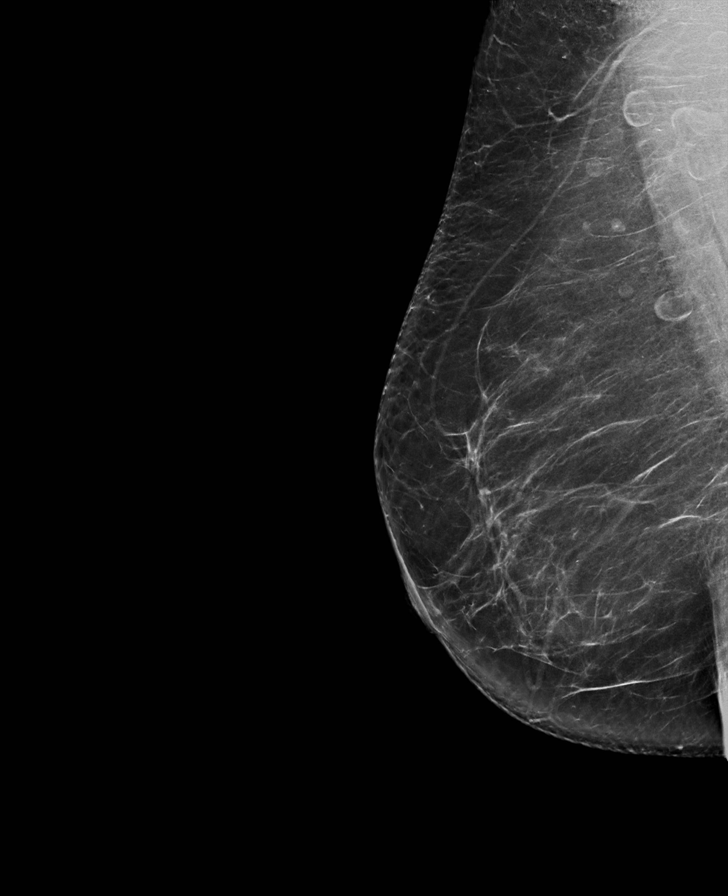

[R CC synth-2D]
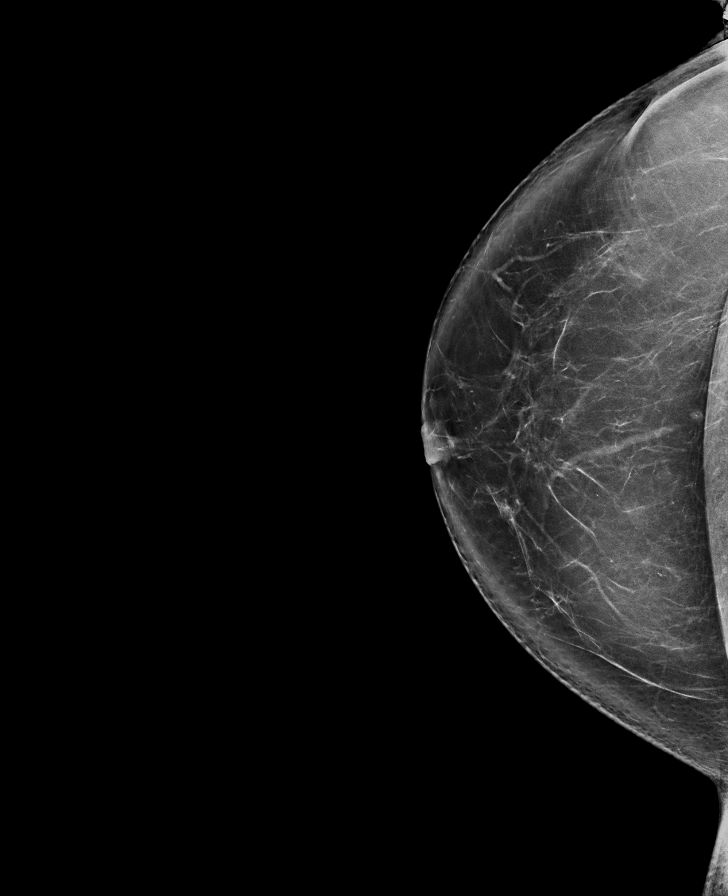

[L MLO synth-2D]
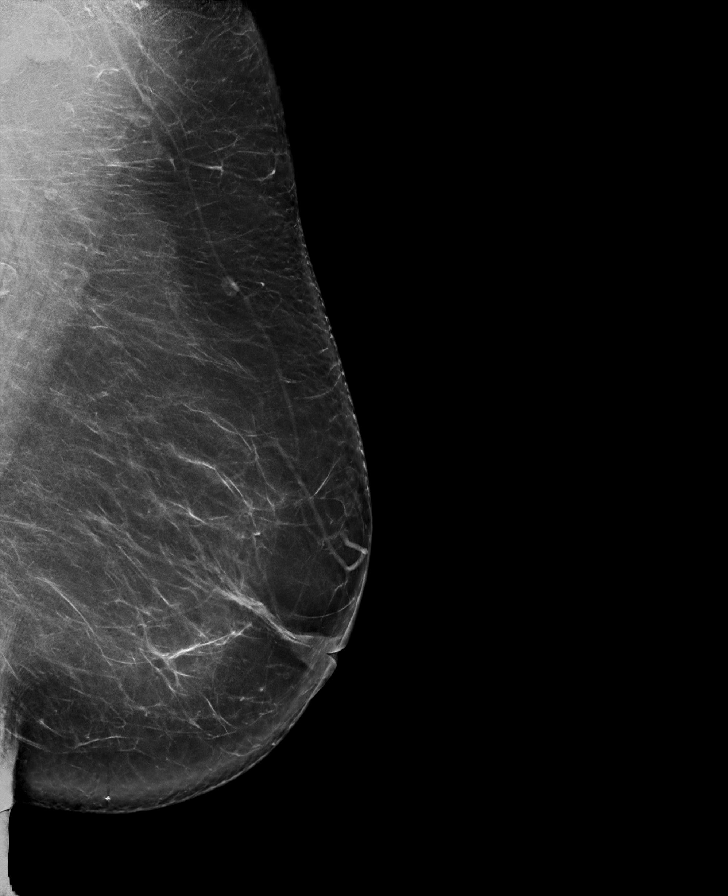

[R CC tomo · tomo slice 47/93.0]
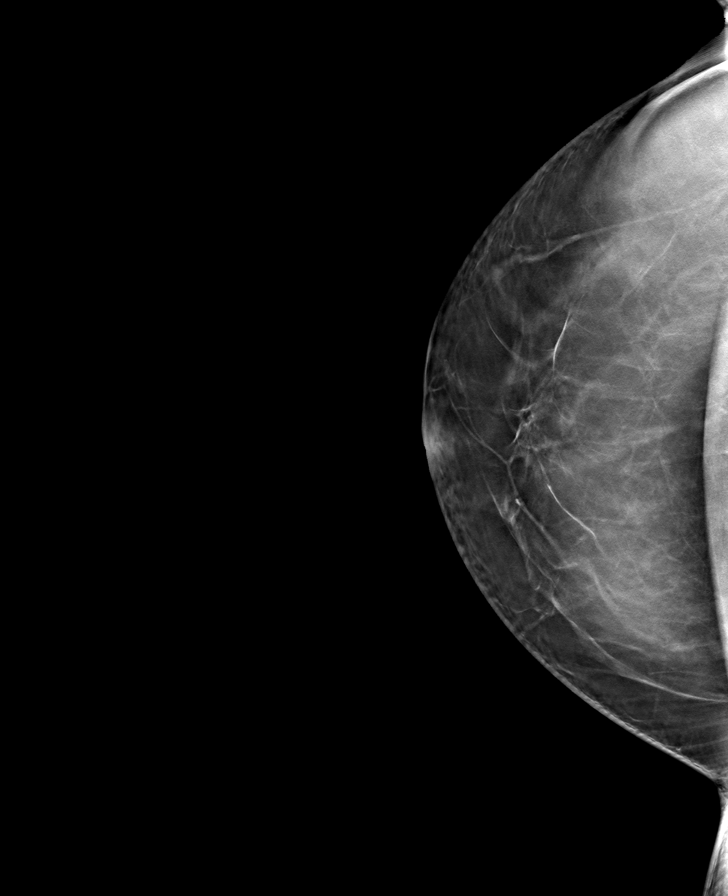

[L MLO tomo · tomo slice 47/92.0]
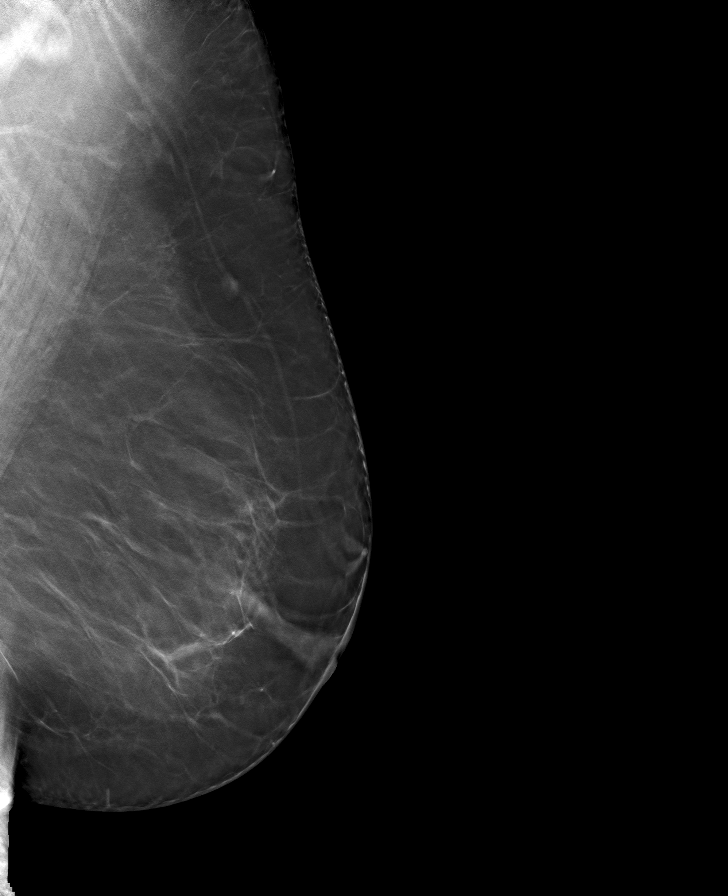

[R MLO tomo · tomo slice 43/84.0]
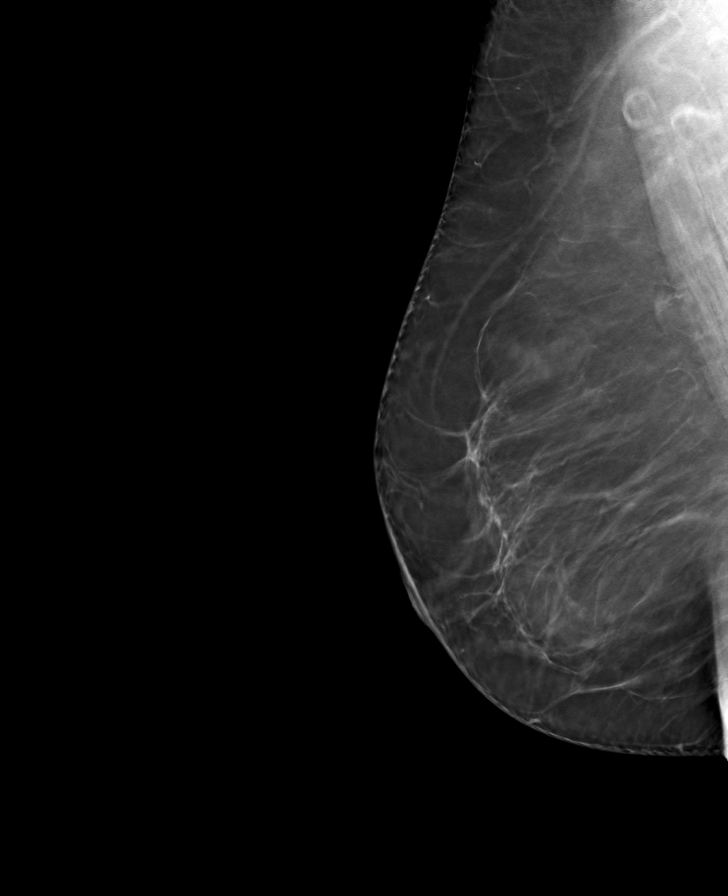

[L CC tomo · tomo slice 45/89.0]
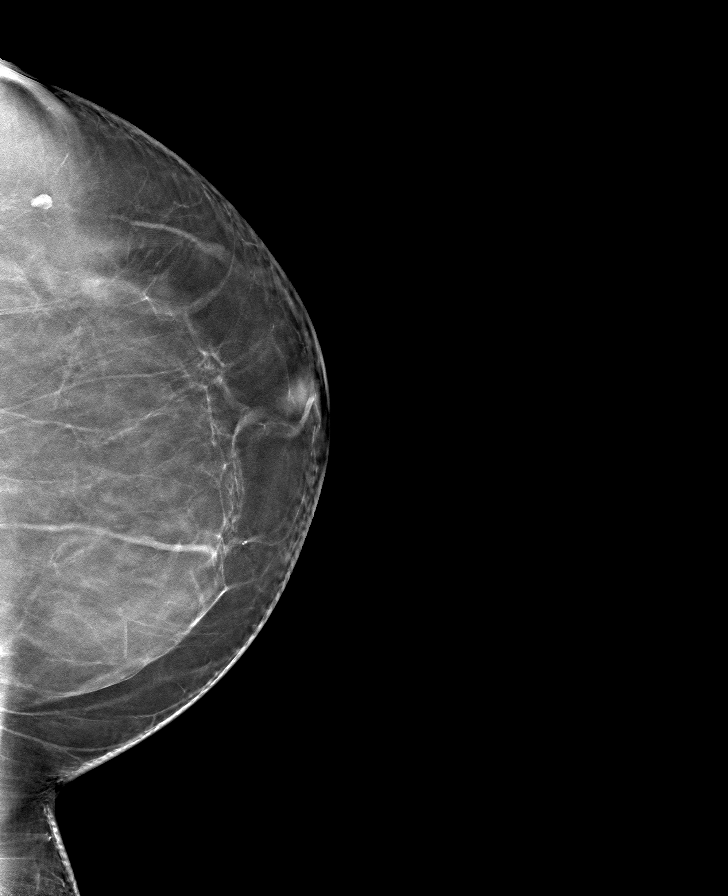

[8 of 24 positions shown; findings below may reference images not displayed]

ACR Breast Density Category b: There are scattered areas of
fibroglandular density.
FINDINGS: There are no findings suspicious for malignancy.
IMPRESSION: No mammographic evidence of malignancy. A result letter of this
screening mammogram will be mailed directly to the patient.

RECOMMENDATION:
Screening mammogram in one year. (Code:51-O-LD2)

BI-RADS CATEGORY  1: Negative.
